# Patient Record
Sex: Female | Born: 1945 | Race: White | Hispanic: No | Smoking: Former smoker
Health system: Southern US, Community
[De-identification: ages and names within clinical notes are randomized; demographics above are authoritative.]

## PROBLEM LIST (undated history)

## (undated) DIAGNOSIS — T8859XA Other complications of anesthesia, initial encounter: Secondary | ICD-10-CM

## (undated) DIAGNOSIS — E119 Type 2 diabetes mellitus without complications: Secondary | ICD-10-CM

## (undated) DIAGNOSIS — Z8719 Personal history of other diseases of the digestive system: Secondary | ICD-10-CM

## (undated) DIAGNOSIS — I10 Essential (primary) hypertension: Secondary | ICD-10-CM

## (undated) DIAGNOSIS — T4145XA Adverse effect of unspecified anesthetic, initial encounter: Secondary | ICD-10-CM

## (undated) DIAGNOSIS — M1990 Unspecified osteoarthritis, unspecified site: Secondary | ICD-10-CM

## (undated) DIAGNOSIS — K589 Irritable bowel syndrome without diarrhea: Secondary | ICD-10-CM

## (undated) DIAGNOSIS — E079 Disorder of thyroid, unspecified: Secondary | ICD-10-CM

## (undated) DIAGNOSIS — K219 Gastro-esophageal reflux disease without esophagitis: Secondary | ICD-10-CM

## (undated) DIAGNOSIS — F329 Major depressive disorder, single episode, unspecified: Secondary | ICD-10-CM

## (undated) DIAGNOSIS — E039 Hypothyroidism, unspecified: Secondary | ICD-10-CM

## (undated) DIAGNOSIS — E785 Hyperlipidemia, unspecified: Secondary | ICD-10-CM

## (undated) DIAGNOSIS — F32A Depression, unspecified: Secondary | ICD-10-CM

## (undated) HISTORY — DX: Disorder of thyroid, unspecified: E07.9

## (undated) HISTORY — PX: JOINT REPLACEMENT: SHX530

## (undated) HISTORY — PX: CHOLECYSTECTOMY: SHX55

## (undated) HISTORY — PX: EYE SURGERY: SHX253

## (undated) HISTORY — PX: SPINE SURGERY: SHX786

## (undated) HISTORY — DX: Hyperlipidemia, unspecified: E78.5

## (undated) HISTORY — DX: Essential (primary) hypertension: I10

## (undated) HISTORY — DX: Gastro-esophageal reflux disease without esophagitis: K21.9

---

## 1970-09-04 HISTORY — PX: ABDOMINAL HYSTERECTOMY: SHX81

## 2004-06-30 ENCOUNTER — Ambulatory Visit: Payer: Self-pay | Admitting: Pain Medicine

## 2004-07-06 ENCOUNTER — Ambulatory Visit: Payer: Self-pay | Admitting: Pain Medicine

## 2004-09-06 ENCOUNTER — Ambulatory Visit: Payer: Self-pay | Admitting: Pain Medicine

## 2004-09-21 ENCOUNTER — Ambulatory Visit: Payer: Self-pay | Admitting: Pain Medicine

## 2004-10-20 ENCOUNTER — Ambulatory Visit: Payer: Self-pay | Admitting: Pain Medicine

## 2004-12-01 ENCOUNTER — Ambulatory Visit: Payer: Self-pay | Admitting: Unknown Physician Specialty

## 2005-06-21 ENCOUNTER — Ambulatory Visit: Payer: Self-pay | Admitting: Ophthalmology

## 2005-12-19 ENCOUNTER — Ambulatory Visit: Payer: Self-pay | Admitting: Ophthalmology

## 2005-12-26 ENCOUNTER — Ambulatory Visit: Payer: Self-pay | Admitting: Ophthalmology

## 2006-07-19 ENCOUNTER — Other Ambulatory Visit: Payer: Self-pay

## 2006-07-30 ENCOUNTER — Inpatient Hospital Stay: Payer: Self-pay | Admitting: Unknown Physician Specialty

## 2007-03-07 ENCOUNTER — Other Ambulatory Visit: Payer: Self-pay

## 2007-03-07 ENCOUNTER — Observation Stay: Payer: Self-pay | Admitting: Endocrinology

## 2007-03-10 ENCOUNTER — Emergency Department: Payer: Self-pay | Admitting: Emergency Medicine

## 2007-03-21 ENCOUNTER — Ambulatory Visit: Payer: Self-pay | Admitting: Orthopaedic Surgery

## 2008-01-22 ENCOUNTER — Ambulatory Visit: Payer: Self-pay | Admitting: Endocrinology

## 2009-07-05 ENCOUNTER — Ambulatory Visit: Payer: Self-pay | Admitting: Pain Medicine

## 2009-07-12 ENCOUNTER — Ambulatory Visit: Payer: Self-pay | Admitting: Pain Medicine

## 2009-07-16 ENCOUNTER — Ambulatory Visit: Payer: Self-pay | Admitting: Pain Medicine

## 2009-07-19 ENCOUNTER — Ambulatory Visit: Payer: Self-pay | Admitting: Pain Medicine

## 2009-09-02 ENCOUNTER — Ambulatory Visit: Payer: Self-pay | Admitting: Pain Medicine

## 2009-09-04 HISTORY — PX: JOINT REPLACEMENT: SHX530

## 2009-09-13 ENCOUNTER — Ambulatory Visit: Payer: Self-pay | Admitting: Pain Medicine

## 2009-10-28 ENCOUNTER — Ambulatory Visit: Payer: Self-pay | Admitting: Pain Medicine

## 2009-11-03 ENCOUNTER — Ambulatory Visit: Payer: Self-pay | Admitting: Pain Medicine

## 2009-12-07 ENCOUNTER — Ambulatory Visit: Payer: Self-pay | Admitting: Pain Medicine

## 2009-12-22 ENCOUNTER — Ambulatory Visit: Payer: Self-pay | Admitting: Pain Medicine

## 2010-02-08 ENCOUNTER — Ambulatory Visit: Payer: Self-pay | Admitting: Physician Assistant

## 2010-03-23 ENCOUNTER — Ambulatory Visit: Payer: Self-pay | Admitting: Unknown Physician Specialty

## 2010-03-29 ENCOUNTER — Inpatient Hospital Stay: Payer: Self-pay | Admitting: Unknown Physician Specialty

## 2010-09-04 HISTORY — PX: COLONOSCOPY: SHX174

## 2010-09-04 HISTORY — PX: UPPER GI ENDOSCOPY: SHX6162

## 2011-05-30 ENCOUNTER — Ambulatory Visit: Payer: Self-pay | Admitting: General Surgery

## 2011-06-02 LAB — PATHOLOGY REPORT

## 2013-01-08 ENCOUNTER — Ambulatory Visit: Payer: Self-pay | Admitting: Orthopedic Surgery

## 2013-01-08 LAB — BASIC METABOLIC PANEL
Chloride: 109 mmol/L — ABNORMAL HIGH (ref 98–107)
Co2: 23 mmol/L (ref 21–32)
Creatinine: 1.09 mg/dL (ref 0.60–1.30)
EGFR (Non-African Amer.): 53 — ABNORMAL LOW
Glucose: 106 mg/dL — ABNORMAL HIGH (ref 65–99)
Osmolality: 279 (ref 275–301)
Potassium: 4.6 mmol/L (ref 3.5–5.1)
Sodium: 138 mmol/L (ref 136–145)

## 2013-01-08 LAB — CBC
HCT: 36.6 % (ref 35.0–47.0)
HGB: 12.2 g/dL (ref 12.0–16.0)
MCH: 31.2 pg (ref 26.0–34.0)
MCHC: 33.2 g/dL (ref 32.0–36.0)
MCV: 94 fL (ref 80–100)
RBC: 3.9 10*6/uL (ref 3.80–5.20)
RDW: 13.6 % (ref 11.5–14.5)
WBC: 7.6 10*3/uL (ref 3.6–11.0)

## 2013-01-08 LAB — PROTIME-INR
INR: 1
Prothrombin Time: 13.3 secs (ref 11.5–14.7)

## 2013-01-21 ENCOUNTER — Inpatient Hospital Stay: Payer: Self-pay | Admitting: Orthopedic Surgery

## 2013-01-22 LAB — BASIC METABOLIC PANEL
Anion Gap: 7 (ref 7–16)
Calcium, Total: 8.5 mg/dL (ref 8.5–10.1)
Chloride: 114 mmol/L — ABNORMAL HIGH (ref 98–107)
Co2: 20 mmol/L — ABNORMAL LOW (ref 21–32)
EGFR (African American): 38 — ABNORMAL LOW
EGFR (Non-African Amer.): 33 — ABNORMAL LOW
Osmolality: 288 (ref 275–301)

## 2013-01-22 LAB — HEMOGLOBIN
HGB: 9 g/dL — ABNORMAL LOW (ref 12.0–16.0)
HGB: 9 g/dL — ABNORMAL LOW (ref 12.0–16.0)

## 2013-01-23 LAB — CBC WITH DIFFERENTIAL/PLATELET
Basophil #: 0 10*3/uL (ref 0.0–0.1)
Basophil %: 0.5 %
HCT: 23.6 % — ABNORMAL LOW (ref 35.0–47.0)
Lymphocyte #: 1.3 10*3/uL (ref 1.0–3.6)
Lymphocyte %: 15.1 %
MCHC: 34.4 g/dL (ref 32.0–36.0)
MCV: 95 fL (ref 80–100)
Monocyte %: 12.3 %
Neutrophil #: 6 10*3/uL (ref 1.4–6.5)
Neutrophil %: 71 %
Platelet: 122 10*3/uL — ABNORMAL LOW (ref 150–440)
RDW: 13.2 % (ref 11.5–14.5)
WBC: 8.5 10*3/uL (ref 3.6–11.0)

## 2013-01-23 LAB — BASIC METABOLIC PANEL
Calcium, Total: 8.5 mg/dL (ref 8.5–10.1)
Chloride: 111 mmol/L — ABNORMAL HIGH (ref 98–107)
Co2: 20 mmol/L — ABNORMAL LOW (ref 21–32)
Creatinine: 1.66 mg/dL — ABNORMAL HIGH (ref 0.60–1.30)
EGFR (African American): 37 — ABNORMAL LOW
EGFR (Non-African Amer.): 32 — ABNORMAL LOW
Glucose: 90 mg/dL (ref 65–99)
Osmolality: 277 (ref 275–301)
Potassium: 4.2 mmol/L (ref 3.5–5.1)
Sodium: 137 mmol/L (ref 136–145)

## 2013-01-23 LAB — URINALYSIS, COMPLETE
Bilirubin,UR: NEGATIVE
Blood: NEGATIVE
Ketone: NEGATIVE
Nitrite: NEGATIVE
RBC,UR: 1 /HPF (ref 0–5)
Specific Gravity: 1.014 (ref 1.003–1.030)
Squamous Epithelial: NONE SEEN
WBC UR: 5 /HPF (ref 0–5)

## 2013-01-23 LAB — PATHOLOGY REPORT

## 2013-01-24 LAB — BASIC METABOLIC PANEL
Anion Gap: 5 — ABNORMAL LOW (ref 7–16)
BUN: 16 mg/dL (ref 7–18)
Calcium, Total: 8.9 mg/dL (ref 8.5–10.1)
Creatinine: 1.22 mg/dL (ref 0.60–1.30)
EGFR (African American): 53 — ABNORMAL LOW
EGFR (Non-African Amer.): 46 — ABNORMAL LOW
Glucose: 109 mg/dL — ABNORMAL HIGH (ref 65–99)
Potassium: 4.1 mmol/L (ref 3.5–5.1)
Sodium: 141 mmol/L (ref 136–145)

## 2013-01-24 LAB — CBC WITH DIFFERENTIAL/PLATELET
Basophil %: 0.8 %
Eosinophil %: 1.3 %
HCT: 27.2 % — ABNORMAL LOW (ref 35.0–47.0)
HGB: 9.4 g/dL — ABNORMAL LOW (ref 12.0–16.0)
Lymphocyte %: 12.4 %
MCH: 31.9 pg (ref 26.0–34.0)
MCHC: 34.6 g/dL (ref 32.0–36.0)
Monocyte %: 11 %
Neutrophil %: 74.5 %
RBC: 2.95 10*6/uL — ABNORMAL LOW (ref 3.80–5.20)
WBC: 8.2 10*3/uL (ref 3.6–11.0)

## 2013-01-24 LAB — URINE CULTURE

## 2013-01-25 LAB — WOUND CULTURE

## 2013-01-28 LAB — CULTURE, BLOOD (SINGLE)

## 2013-06-27 DIAGNOSIS — K3533 Acute appendicitis with perforation and localized peritonitis, with abscess: Secondary | ICD-10-CM

## 2013-06-27 LAB — URINALYSIS, COMPLETE
Bacteria: NONE SEEN
Blood: NEGATIVE
Glucose,UR: NEGATIVE mg/dL (ref 0–75)
Hyaline Cast: 63
Ketone: NEGATIVE
Nitrite: NEGATIVE
Protein: NEGATIVE
RBC,UR: 3 /HPF (ref 0–5)
Squamous Epithelial: 1
WBC UR: 53 /HPF (ref 0–5)

## 2013-06-27 LAB — CBC
HCT: 34.2 % — ABNORMAL LOW (ref 35.0–47.0)
MCH: 31.7 pg (ref 26.0–34.0)
MCHC: 33.7 g/dL (ref 32.0–36.0)
MCV: 94 fL (ref 80–100)
RBC: 3.63 10*6/uL — ABNORMAL LOW (ref 3.80–5.20)
RDW: 14.7 % — ABNORMAL HIGH (ref 11.5–14.5)

## 2013-06-27 LAB — COMPREHENSIVE METABOLIC PANEL
Albumin: 4.2 g/dL (ref 3.4–5.0)
BUN: 22 mg/dL — ABNORMAL HIGH (ref 7–18)
Bilirubin,Total: 0.6 mg/dL (ref 0.2–1.0)
Calcium, Total: 9.8 mg/dL (ref 8.5–10.1)
Chloride: 108 mmol/L — ABNORMAL HIGH (ref 98–107)
Creatinine: 1.64 mg/dL — ABNORMAL HIGH (ref 0.60–1.30)
Osmolality: 275 (ref 275–301)
SGPT (ALT): 26 U/L (ref 12–78)
Total Protein: 8.1 g/dL (ref 6.4–8.2)

## 2013-06-27 LAB — LIPASE, BLOOD: Lipase: 161 U/L (ref 73–393)

## 2013-06-28 HISTORY — PX: APPENDECTOMY: SHX54

## 2013-06-28 LAB — CBC WITH DIFFERENTIAL/PLATELET
Basophil #: 0 10*3/uL (ref 0.0–0.1)
Basophil %: 0.3 %
Eosinophil #: 0 10*3/uL (ref 0.0–0.7)
Eosinophil %: 0 %
HCT: 31.6 % — ABNORMAL LOW (ref 35.0–47.0)
HGB: 10.8 g/dL — ABNORMAL LOW (ref 12.0–16.0)
Lymphocyte %: 9.6 %
MCH: 32.8 pg (ref 26.0–34.0)
MCHC: 34.1 g/dL (ref 32.0–36.0)
Monocyte #: 0.5 x10 3/mm (ref 0.2–0.9)
Monocyte %: 6.6 %
Neutrophil #: 6.2 10*3/uL (ref 1.4–6.5)
Neutrophil %: 83.5 %
RBC: 3.28 10*6/uL — ABNORMAL LOW (ref 3.80–5.20)
RDW: 14.7 % — ABNORMAL HIGH (ref 11.5–14.5)

## 2013-06-28 LAB — BASIC METABOLIC PANEL
Anion Gap: 9 (ref 7–16)
Co2: 20 mmol/L — ABNORMAL LOW (ref 21–32)
Creatinine: 1.96 mg/dL — ABNORMAL HIGH (ref 0.60–1.30)
EGFR (African American): 30 — ABNORMAL LOW
Osmolality: 281 (ref 275–301)
Potassium: 4.3 mmol/L (ref 3.5–5.1)
Sodium: 139 mmol/L (ref 136–145)

## 2013-06-29 ENCOUNTER — Inpatient Hospital Stay: Payer: Self-pay | Admitting: General Surgery

## 2013-06-29 LAB — BASIC METABOLIC PANEL
Anion Gap: 9 (ref 7–16)
BUN: 26 mg/dL — ABNORMAL HIGH (ref 7–18)
Calcium, Total: 8.8 mg/dL (ref 8.5–10.1)
Co2: 19 mmol/L — ABNORMAL LOW (ref 21–32)
Glucose: 105 mg/dL — ABNORMAL HIGH (ref 65–99)
Osmolality: 275 (ref 275–301)
Potassium: 4.1 mmol/L (ref 3.5–5.1)
Sodium: 135 mmol/L — ABNORMAL LOW (ref 136–145)

## 2013-06-29 LAB — CBC WITH DIFFERENTIAL/PLATELET
Basophil #: 0 10*3/uL (ref 0.0–0.1)
Basophil %: 0.2 %
HCT: 27.8 % — ABNORMAL LOW (ref 35.0–47.0)
Lymphocyte %: 7.7 %
MCH: 32.5 pg (ref 26.0–34.0)
MCHC: 34.1 g/dL (ref 32.0–36.0)
MCV: 95 fL (ref 80–100)
Monocyte #: 0.7 x10 3/mm (ref 0.2–0.9)
Monocyte %: 7 %
Neutrophil #: 8.5 10*3/uL — ABNORMAL HIGH (ref 1.4–6.5)
Platelet: 208 10*3/uL (ref 150–440)
RBC: 2.91 10*6/uL — ABNORMAL LOW (ref 3.80–5.20)
RDW: 14.4 % (ref 11.5–14.5)
WBC: 10 10*3/uL (ref 3.6–11.0)

## 2013-06-30 ENCOUNTER — Encounter: Payer: Self-pay | Admitting: General Surgery

## 2013-06-30 LAB — CBC WITH DIFFERENTIAL/PLATELET
Basophil #: 0 10*3/uL (ref 0.0–0.1)
Basophil %: 0.3 %
Eosinophil #: 0 10*3/uL (ref 0.0–0.7)
HGB: 9.8 g/dL — ABNORMAL LOW (ref 12.0–16.0)
Lymphocyte %: 7.9 %
MCH: 32.4 pg (ref 26.0–34.0)
MCHC: 34.2 g/dL (ref 32.0–36.0)
MCV: 95 fL (ref 80–100)
Monocyte %: 6.1 %
Neutrophil #: 9 10*3/uL — ABNORMAL HIGH (ref 1.4–6.5)
Neutrophil %: 85.3 %
Platelet: 255 10*3/uL (ref 150–440)
RBC: 3.01 10*6/uL — ABNORMAL LOW (ref 3.80–5.20)
RDW: 14.2 % (ref 11.5–14.5)

## 2013-06-30 LAB — BASIC METABOLIC PANEL
Anion Gap: 9 (ref 7–16)
BUN: 25 mg/dL — ABNORMAL HIGH (ref 7–18)
Calcium, Total: 9.3 mg/dL (ref 8.5–10.1)
Chloride: 104 mmol/L (ref 98–107)
Co2: 22 mmol/L (ref 21–32)
Creatinine: 1.79 mg/dL — ABNORMAL HIGH (ref 0.60–1.30)
EGFR (Non-African Amer.): 29 — ABNORMAL LOW
Osmolality: 274 (ref 275–301)
Potassium: 4.1 mmol/L (ref 3.5–5.1)

## 2013-07-01 LAB — PATHOLOGY REPORT

## 2013-07-02 LAB — BASIC METABOLIC PANEL
Calcium, Total: 9.2 mg/dL (ref 8.5–10.1)
Chloride: 108 mmol/L — ABNORMAL HIGH (ref 98–107)
Creatinine: 1.34 mg/dL — ABNORMAL HIGH (ref 0.60–1.30)
EGFR (African American): 47 — ABNORMAL LOW
Glucose: 78 mg/dL (ref 65–99)
Osmolality: 282 (ref 275–301)

## 2013-07-02 LAB — CBC WITH DIFFERENTIAL/PLATELET
Basophil %: 0.3 %
Eosinophil #: 0.1 10*3/uL (ref 0.0–0.7)
Eosinophil %: 0.9 %
HCT: 29.7 % — ABNORMAL LOW (ref 35.0–47.0)
HGB: 10.2 g/dL — ABNORMAL LOW (ref 12.0–16.0)
Lymphocyte #: 1.8 10*3/uL (ref 1.0–3.6)
Lymphocyte %: 20.8 %
MCH: 32 pg (ref 26.0–34.0)
MCHC: 34.4 g/dL (ref 32.0–36.0)
Monocyte #: 0.7 x10 3/mm (ref 0.2–0.9)
Monocyte %: 8.4 %
Neutrophil #: 6 10*3/uL (ref 1.4–6.5)
Neutrophil %: 69.6 %
Platelet: 368 10*3/uL (ref 150–440)
RBC: 3.19 10*6/uL — ABNORMAL LOW (ref 3.80–5.20)
WBC: 8.6 10*3/uL (ref 3.6–11.0)

## 2013-07-03 ENCOUNTER — Encounter: Payer: Self-pay | Admitting: General Surgery

## 2013-07-03 LAB — URINALYSIS, COMPLETE
Blood: NEGATIVE
Glucose,UR: NEGATIVE mg/dL (ref 0–75)
Hyaline Cast: 5
Nitrite: NEGATIVE
Protein: 30
RBC,UR: 4 /HPF (ref 0–5)
Specific Gravity: 1.013 (ref 1.003–1.030)
WBC UR: 89 /HPF (ref 0–5)

## 2013-07-04 ENCOUNTER — Encounter: Payer: Self-pay | Admitting: General Surgery

## 2013-07-04 LAB — BASIC METABOLIC PANEL
BUN: 6 mg/dL — ABNORMAL LOW (ref 7–18)
Calcium, Total: 8.1 mg/dL — ABNORMAL LOW (ref 8.5–10.1)
Chloride: 109 mmol/L — ABNORMAL HIGH (ref 98–107)
Co2: 25 mmol/L (ref 21–32)
EGFR (African American): 45 — ABNORMAL LOW
Glucose: 103 mg/dL — ABNORMAL HIGH (ref 65–99)

## 2013-07-04 LAB — URINALYSIS, COMPLETE
Bilirubin,UR: NEGATIVE
Blood: NEGATIVE
Glucose,UR: NEGATIVE mg/dL (ref 0–75)
Nitrite: NEGATIVE
Ph: 5 (ref 4.5–8.0)
Protein: NEGATIVE
RBC,UR: 2 /HPF (ref 0–5)
Specific Gravity: 1.012 (ref 1.003–1.030)
WBC UR: 12 /HPF (ref 0–5)

## 2013-07-05 LAB — URINE CULTURE

## 2013-07-15 ENCOUNTER — Encounter: Payer: Self-pay | Admitting: General Surgery

## 2013-07-15 ENCOUNTER — Ambulatory Visit (INDEPENDENT_AMBULATORY_CARE_PROVIDER_SITE_OTHER): Payer: Self-pay | Admitting: General Surgery

## 2013-07-15 VITALS — BP 122/78 | HR 84 | Resp 16 | Ht 63.0 in | Wt 206.0 lb

## 2013-07-15 DIAGNOSIS — R197 Diarrhea, unspecified: Secondary | ICD-10-CM | POA: Insufficient documentation

## 2013-07-15 DIAGNOSIS — R109 Unspecified abdominal pain: Secondary | ICD-10-CM

## 2013-07-15 DIAGNOSIS — K3580 Unspecified acute appendicitis: Secondary | ICD-10-CM

## 2013-07-15 NOTE — Progress Notes (Signed)
Patient ID: Brandy George, female   DOB: 11/20/1945, 67 y.o.   MRN: [id number]  Chief Complaint  Patient presents with  . Routine Post Op    Post op emergency appendectomy    HPI Brandy George is a 67 y.o. female who presents for a post op emergency appendectomy. The procedure was performed on 06/28/13. The patient states since surgery she has had difficultly swallowing food as well as medications. She complains of diarrhea and has not had a solid bowel movement since surgery. She also states she is having abdominal soreness. She states Sunday she had 2 bad diarrhea spells. She has a bloating sensation.   HPI  Past Medical History  Diagnosis Date  . Hypertension   . Thyroid disease   . Hyperlipidemia   . GERD (gastroesophageal reflux disease)     Past Surgical History  Procedure Laterality Date  . Appendectomy  2014  . Cholecystectomy    . Joint replacement Bilateral [id number]  . Joint replacement Right 2011    hip  . Spine surgery      lower back  . Abdominal hysterectomy  1972    partial    History reviewed. No pertinent family history.  Social History History  Substance Use Topics  . Smoking status: Former Smoker -- 20 years  . Smokeless tobacco: Never Used  . Alcohol Use: Yes    Allergies  Allergen Reactions  . Morphine Itching and Rash  . Tape Other (See Comments) and Rash    Current Outpatient Prescriptions  Medication Sig Dispense Refill  . allopurinol (ZYLOPRIM) 100 MG tablet Take 1 tablet by mouth 2 (two) times daily.      Marland Kitchen ALPRAZolam (XANAX) 0.25 MG tablet Take 1 tablet by mouth daily.      Francia Greaves THYROID PO Take 3 g by mouth daily.      Marland Kitchen gemfibrozil (LOPID) 600 MG tablet Take 1 tablet by mouth daily.      Marland Kitchen lisinopril-hydrochlorothiazide (PRINZIDE,ZESTORETIC) 20-25 MG per tablet Take 1 tablet by mouth daily.      . Multiple Vitamin (MULTIVITAMIN) tablet Take 1 tablet by mouth daily.      Marland Kitchen omeprazole (PRILOSEC) 40 MG capsule Take 1  capsule by mouth daily.      . simvastatin (ZOCOR) 20 MG tablet Take 1 tablet by mouth daily.      Marland Kitchen triamterene-hydrochlorothiazide (MAXZIDE-25) 37.5-25 MG per tablet Take 1 tablet by mouth daily.       No current facility-administered medications for this visit.    Review of Systems Review of Systems  Constitutional: Positive for appetite change.  Respiratory: Negative.   Cardiovascular: Negative.   Gastrointestinal: Positive for abdominal pain and diarrhea.    Blood pressure 122/78, pulse 84, resp. rate 16, height 5\' 3"  (1.6 m), weight 206 lb (93.441 kg).  Physical Exam Physical Exam  Constitutional: She is oriented to person, place, and time. She appears well-developed and well-nourished.  Cardiovascular: Normal rate, regular rhythm and normal heart sounds.   No murmur heard. Pulmonary/Chest: Effort normal and breath sounds normal.  Abdominal: Soft. Normal appearance and bowel sounds are normal. There is tenderness in the right lower quadrant.  Genitourinary: Rectal exam shows no mass (no evidence of pelvic abscess on rectal exam.) and anal tone normal. Guaiac negative stool.  Neurological: She is alert and oriented to person, place, and time.  Skin: Skin is warm and dry.    Data Reviewed Pathology showed acute appendicitis with marked transmural acute inflammation.  June 30, 2013.  Assessment    Persistent loose stools status post appendectomy for acute appendicitis complicated by a postoperative ileus.     Plan    The patient may have residual inflammation or less likely C. Difficile colitis.  Considering her persistent mild tenderness, decreased appetite and general malaise, a post appendectomy CT will be obtained.     Patient to have the following labs drawn at [facility] lab: CBC and Met B.   This patient has also been scheduled for a CT abdomen/pelvis with IV and oral contrast at [facility] for 07-17-13 at 9 am (arrive at 8:45 am).  Prep: no solids 4 hours prior  but patient may have clear liquids up until exam time, pick up prep kit, and take medication list. Patient verbalizes understanding.     Robert Bellow 07/15/2013, 9:03 PM

## 2013-07-15 NOTE — Patient Instructions (Addendum)
Follow up to be announced. Patient to have lab work done today. Also patient to have CT scan of the abdomen scheduled.

## 2013-07-16 ENCOUNTER — Other Ambulatory Visit: Payer: Self-pay

## 2013-07-16 ENCOUNTER — Telehealth: Payer: Self-pay

## 2013-07-16 DIAGNOSIS — R197 Diarrhea, unspecified: Secondary | ICD-10-CM

## 2013-07-16 LAB — BASIC METABOLIC PANEL
BUN/Creatinine Ratio: 10 — ABNORMAL LOW (ref 11–26)
BUN: 15 mg/dL (ref 8–27)
CO2: 25 mmol/L (ref 18–29)
Calcium: 8.2 mg/dL — ABNORMAL LOW (ref 8.6–10.2)
Chloride: 95 mmol/L — ABNORMAL LOW (ref 97–108)
Creatinine, Ser: 1.55 mg/dL — ABNORMAL HIGH (ref 0.57–1.00)
GFR calc Af Amer: 40 mL/min/{1.73_m2} — ABNORMAL LOW (ref >59)
GFR calc non Af Amer: 34 mL/min/{1.73_m2} — ABNORMAL LOW (ref >59)
Glucose: 119 mg/dL — ABNORMAL HIGH (ref 65–99)
Potassium: 3.6 mmol/L (ref 3.5–5.2)

## 2013-07-16 LAB — CBC WITH DIFFERENTIAL
Basophils Absolute: 0 10*3/uL (ref 0.0–0.2)
Eosinophils Absolute: 0 10*3/uL (ref 0.0–0.4)
HCT: 32.7 % — ABNORMAL LOW (ref 34.0–46.6)
Immature Granulocytes: 0 %
Lymphs: 24 %
MCH: 31 pg (ref 26.6–33.0)
MCHC: 33.9 g/dL (ref 31.5–35.7)
Monocytes: 10 %
Neutrophils Absolute: 7.9 10*3/uL — ABNORMAL HIGH (ref 1.4–7.0)
Platelets: 482 10*3/uL — ABNORMAL HIGH (ref 150–379)
RDW: 14.3 % (ref 12.3–15.4)
WBC: 12.2 10*3/uL — ABNORMAL HIGH (ref 3.4–10.8)

## 2013-07-16 NOTE — Telephone Encounter (Signed)
Spoke with patient and let her know Dr Bary Castilla wanted her to have a Stool C-Diff completed today. Patient to come in before noon to pick up lab slip and get instructions and collection kit from [employer].

## 2013-07-17 ENCOUNTER — Encounter: Payer: Self-pay | Admitting: General Surgery

## 2013-07-17 ENCOUNTER — Ambulatory Visit (INDEPENDENT_AMBULATORY_CARE_PROVIDER_SITE_OTHER): Payer: Medicare Other | Admitting: General Surgery

## 2013-07-17 ENCOUNTER — Ambulatory Visit: Payer: Self-pay | Admitting: General Surgery

## 2013-07-17 VITALS — BP 142/68 | HR 84 | Temp 98.1°F | Resp 18 | Ht 63.0 in | Wt 203.0 lb

## 2013-07-17 DIAGNOSIS — K3580 Unspecified acute appendicitis: Secondary | ICD-10-CM

## 2013-07-17 DIAGNOSIS — R110 Nausea: Secondary | ICD-10-CM

## 2013-07-17 MED ORDER — AMOXICILLIN-POT CLAVULANATE 875-125 MG PO TABS: 1.0000 | ORAL_TABLET | Freq: Two times a day (BID) | ORAL | Status: DC

## 2013-07-17 MED ORDER — ONDANSETRON 4 MG PO TBDP: 4.0000 mg | ORAL_TABLET | Freq: Four times a day (QID) | ORAL | Status: DC | PRN

## 2013-07-17 NOTE — Patient Instructions (Addendum)
Patient to return in one week. Patient to get on a probiotic.

## 2013-07-17 NOTE — Progress Notes (Signed)
Patient ID: Brandy George, female   DOB: 07-14-1946, 67 y.o.   MRN: [id number]  Chief Complaint  Patient presents with  . Routine Post Op    HPI Brandy George is a 67 y.o. female here today for her post op Appendectomy done on 06/28/13. Patient can a ct done 07/17/13. Patient states she is still having abdominal paincome nausea and  Diarrhea. She is tolerating liquids and soft foods. She has a long list of foods that she does not routinely. She is not aware of any fever or chills. She reports difficulty with swallowing pills. The patient has no difficulty with liquids. She did not have a nasogastric tube during her hospitalization.  HPI  Past Medical History  Diagnosis Date  . Hypertension   . Thyroid disease   . Hyperlipidemia   . GERD (gastroesophageal reflux disease)     Past Surgical History  Procedure Laterality Date  . Cholecystectomy    . Joint replacement Bilateral [id number]  . Joint replacement Right 2011    hip  . Spine surgery      lower back  . Abdominal hysterectomy  1972    partial  . Appendectomy  06/28/13    No family history on file.  Social History History  Substance Use Topics  . Smoking status: Former Smoker -- 20 years  . Smokeless tobacco: Never Used  . Alcohol Use: Yes    Allergies  Allergen Reactions  . Morphine Itching and Rash  . Tape Other (See Comments) and Rash    Current Outpatient Prescriptions  Medication Sig Dispense Refill  . allopurinol (ZYLOPRIM) 100 MG tablet Take 1 tablet by mouth 2 (two) times daily.      Marland Kitchen ALPRAZolam (XANAX) 0.25 MG tablet Take 1 tablet by mouth daily.      Francia Greaves THYROID PO Take 3 g by mouth daily.      Marland Kitchen gemfibrozil (LOPID) 600 MG tablet Take 1 tablet by mouth daily.      Marland Kitchen lisinopril-hydrochlorothiazide (PRINZIDE,ZESTORETIC) 20-25 MG per tablet Take 1 tablet by mouth daily.      . Multiple Vitamin (MULTIVITAMIN) tablet Take 1 tablet by mouth daily.      Marland Kitchen omeprazole (PRILOSEC) 40 MG  capsule Take 1 capsule by mouth daily.      . simvastatin (ZOCOR) 20 MG tablet Take 1 tablet by mouth daily.      Marland Kitchen triamterene-hydrochlorothiazide (MAXZIDE-25) 37.5-25 MG per tablet Take 1 tablet by mouth daily.      Marland Kitchen amoxicillin-clavulanate (AUGMENTIN) 875-125 MG per tablet Take 1 tablet by mouth 2 (two) times daily.  20 tablet  1  . ondansetron (ZOFRAN ODT) 4 MG disintegrating tablet Take 1 tablet (4 mg total) by mouth 4 (four) times daily as needed for nausea or vomiting.  20 tablet  1   No current facility-administered medications for this visit.    Review of Systems Review of Systems  Constitutional: Negative.   Respiratory: Negative.   Cardiovascular: Negative.   Gastrointestinal: Positive for nausea, abdominal pain and diarrhea. Negative for blood in stool, abdominal distention, anal bleeding and rectal pain.    Blood pressure 142/68, pulse 84, temperature 98.1 F (36.7 C), resp. rate 18, height 5\' 3"  (1.6 m), weight 203 lb (92.08 kg).  Physical Exam Physical Exam  Constitutional: She is oriented to person, place, and time. She appears well-developed and well-nourished.  Eyes: No scleral icterus.  Cardiovascular: Normal rate and regular rhythm.   Abdominal: Soft. Normal appearance and bowel  sounds are normal.    Lymphadenopathy:    She has no cervical adenopathy.  Neurological: She is alert and oriented to person, place, and time.  Skin: Skin is dry.    Data Reviewed Laboratory studies obtained a #11, 2014 showed a mild leukocytosis as well as a slight rise in her creatinine from discharge low at 1.3 to her present level of 1.5.  CT completed earlier today shows inflammatory changes in the right lower quadrant as well as a less than 5 cm fluid collection in the pelvis. (This was not clinically appreciated on palpation during her rectal exam 2 days ago.  Assessment    Post appendectomy and intra-abdominal fluid collection, possible early abscess. Likely source for  present diarrhea.     Plan    Stool culture for C. Difficile was submitted this morning. The patient will be placed on Augmentin 875 mg b.i.d. She is encouraged to either make use of the overt or a probiotic daily. Follow up examination in one week.        Robert Bellow 07/18/2013, 5:54 AM

## 2013-07-23 ENCOUNTER — Encounter: Payer: Self-pay | Admitting: General Surgery

## 2013-07-23 ENCOUNTER — Ambulatory Visit (INDEPENDENT_AMBULATORY_CARE_PROVIDER_SITE_OTHER): Payer: Self-pay | Admitting: General Surgery

## 2013-07-23 VITALS — BP 130/62 | HR 68 | Temp 98.3°F | Resp 16 | Ht 63.0 in | Wt 204.0 lb

## 2013-07-23 DIAGNOSIS — K3580 Unspecified acute appendicitis: Secondary | ICD-10-CM

## 2013-07-23 NOTE — Progress Notes (Signed)
Patient ID: Brandy George, female   DOB: May 14, 1946, 67 y.o.   MRN: [id number]  Chief Complaint  Patient presents with  . Other    HPI Brandy George is a 67 y.o. female here today for her post op Appendectomy done on 06/28/13. Patient can a ct done 07/17/13. Patient states she is still having abdominal pain, especially after meals. She is tolerating liquids and soft foods. Patient states she has been having 4 to 5 bowel movements daily, usually in the morning.  HPI  Past Medical History  Diagnosis Date  . Hypertension   . Thyroid disease   . Hyperlipidemia   . GERD (gastroesophageal reflux disease)     Past Surgical History  Procedure Laterality Date  . Cholecystectomy    . Joint replacement Bilateral [id number]  . Joint replacement Right 2011    hip  . Spine surgery      lower back  . Abdominal hysterectomy  1972    partial  . Appendectomy  06/28/13    No family history on file.  Social History History  Substance Use Topics  . Smoking status: Former Smoker -- 20 years  . Smokeless tobacco: Never Used  . Alcohol Use: Yes    Allergies  Allergen Reactions  . Morphine Itching and Rash  . Tape Other (See Comments) and Rash    Current Outpatient Prescriptions  Medication Sig Dispense Refill  . allopurinol (ZYLOPRIM) 100 MG tablet Take 1 tablet by mouth 2 (two) times daily.      Marland Kitchen ALPRAZolam (XANAX) 0.25 MG tablet Take 1 tablet by mouth daily.      Marland Kitchen amoxicillin-clavulanate (AUGMENTIN) 875-125 MG per tablet Take 1 tablet by mouth 2 (two) times daily.  20 tablet  1  . ARMOUR THYROID PO Take 3 g by mouth daily.      Marland Kitchen gemfibrozil (LOPID) 600 MG tablet Take 1 tablet by mouth daily.      Marland Kitchen lisinopril-hydrochlorothiazide (PRINZIDE,ZESTORETIC) 20-25 MG per tablet Take 1 tablet by mouth daily.      . Multiple Vitamin (MULTIVITAMIN) tablet Take 1 tablet by mouth daily.      Marland Kitchen omeprazole (PRILOSEC) 40 MG capsule Take 1 capsule by mouth daily.      . ondansetron  (ZOFRAN ODT) 4 MG disintegrating tablet Take 1 tablet (4 mg total) by mouth 4 (four) times daily as needed for nausea or vomiting.  20 tablet  1  . simvastatin (ZOCOR) 20 MG tablet Take 1 tablet by mouth daily.      Marland Kitchen triamterene-hydrochlorothiazide (MAXZIDE-25) 37.5-25 MG per tablet Take 1 tablet by mouth daily.       No current facility-administered medications for this visit.    Review of Systems Review of Systems  Constitutional: Negative.   Respiratory: Negative.   Cardiovascular: Negative.     Blood pressure 130/62, pulse 68, temperature 98.3 F (36.8 C), resp. rate 16, height 5\' 3"  (1.6 m), weight 204 lb (92.534 kg). The patient's weight is up 1 pound from last week. Physical Exam Physical Exam  Constitutional: She is oriented to person, place, and time. She appears well-developed and well-nourished.  Eyes: No scleral icterus.  Cardiovascular: Normal rate, regular rhythm and normal heart sounds.   Pulmonary/Chest: Breath sounds normal.  Abdominal: Soft. Bowel sounds are normal. There is no tenderness.  Lymphadenopathy:    She has no cervical adenopathy.  Neurological: She is alert and oriented to person, place, and time.  Skin: Skin is warm and dry.  Data Reviewed CT previously reviewed. C. Difficile stool sample results pending.  Assessment    Resolving intra-abdominal abscess/fluid collection. Good tolerance of oral Augmentin.     Plan    The patient reported decreased stool frequency is encouraging. She is still experiencing postprandial pain. There was no evidence of obstruction or small intestinal dilatation on the CT completed last week. She's been encouraged to eat a high-protein diet.        Robert Bellow 07/23/2013, 8:30 PM

## 2013-07-23 NOTE — Patient Instructions (Signed)
Patient to return in two weeks  

## 2013-07-25 LAB — C DIFFICILE, CYTOTOXIN B

## 2013-07-25 LAB — C DIFFICILE TOXINS A+B W/RFLX: C difficile Toxins A+B, EIA: NEGATIVE

## 2013-08-04 ENCOUNTER — Ambulatory Visit: Payer: Medicare Other | Admitting: General Surgery

## 2013-08-04 ENCOUNTER — Encounter: Payer: Self-pay | Admitting: General Surgery

## 2013-08-04 ENCOUNTER — Ambulatory Visit (INDEPENDENT_AMBULATORY_CARE_PROVIDER_SITE_OTHER): Payer: Self-pay | Admitting: General Surgery

## 2013-08-04 VITALS — BP 124/76 | HR 80 | Resp 14 | Ht 63.5 in | Wt 204.0 lb

## 2013-08-04 DIAGNOSIS — K3580 Unspecified acute appendicitis: Secondary | ICD-10-CM

## 2013-08-04 NOTE — Patient Instructions (Signed)
Patient to return  

## 2013-08-04 NOTE — Progress Notes (Signed)
Patient ID: Brandy George, female   DOB: 1946-02-16, 67 y.o.   MRN: [id number]  Chief Complaint  Patient presents with  . Follow-up     post op appendectomy    HPI CHELLY George is a 67 y.o. female who presents for a post op appendectomy. The procedure was performed on 06/28/13. The patient states she is still having intermittent lower right quadrant abdominal pain. She states that some days the pain is better and then see has had days that the pain is really bad. 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 Increase physical activity seems to correlate with episodes of increasing discomfort.  She states that her bowel movements are beginning to get back to normal. She is now only having bowel movements 3-4 times daily,and reports the consistency is almost a formed stool at this time. Appetite is better.   She has been making use of probiotics as requested.  HPI  Past Medical History  Diagnosis Date  . Hypertension   . Thyroid disease   . Hyperlipidemia   . GERD (gastroesophageal reflux disease)     Past Surgical History  Procedure Laterality Date  . Cholecystectomy    . Joint replacement Bilateral [id number]  . Joint replacement Right 2011    hip  . Spine surgery      lower back  . Abdominal hysterectomy  1972    partial  . Appendectomy  06/28/13  . Colonoscopy  2012  . Upper gi endoscopy  2012    History reviewed. No pertinent family history.  Social History History  Substance Use Topics  . Smoking status: Former Smoker -- 20 years  . Smokeless tobacco: Never Used  . Alcohol Use: Yes    Allergies  Allergen Reactions  . Morphine Itching and Rash  . Tape Other (See Comments) and Rash    Current Outpatient Prescriptions  Medication Sig Dispense Refill  . allopurinol (ZYLOPRIM) 100 MG tablet Take 1 tablet by mouth 2 (two) times daily.      Marland Kitchen ALPRAZolam (XANAX)  0.25 MG tablet Take 1 tablet by mouth daily.      Francia Greaves THYROID PO Take 3 g by mouth daily.      Marland Kitchen gemfibrozil (LOPID) 600 MG tablet Take 1 tablet by mouth daily.      Marland Kitchen lisinopril-hydrochlorothiazide (PRINZIDE,ZESTORETIC) 20-25 MG per tablet Take 1 tablet by mouth daily.      . Multiple Vitamin (MULTIVITAMIN) tablet Take 1 tablet by mouth daily.      Marland Kitchen omeprazole (PRILOSEC) 40 MG capsule Take 1 capsule by mouth daily.      . simvastatin (ZOCOR) 20 MG tablet Take 1 tablet by mouth daily.      Marland Kitchen triamterene-hydrochlorothiazide (MAXZIDE-25) 37.5-25 MG per tablet Take 1 tablet by mouth daily.       No current facility-administered medications for this visit.    Review of Systems Review of Systems  Constitutional: Negative.   Respiratory: Negative.   Cardiovascular: Negative.   Gastrointestinal: Positive for abdominal pain.    Blood pressure 124/76, pulse 80, resp. rate 14, height 5' 3.5" (1.613 m), weight 204 lb (92.534 kg).  Physical Exam Physical Exam  Constitutional: She is oriented to person, place, and time. She appears well-developed and well-nourished.  Neck: Neck supple. No thyromegaly present.  Cardiovascular: Normal rate, regular rhythm and normal heart sounds.   No murmur heard. Pulmonary/Chest: Effort normal and  breath sounds normal.  Abdominal: Soft. Normal appearance and bowel sounds are normal. There is tenderness in the right lower quadrant.    Lymphadenopathy:    She has no cervical adenopathy.  Neurological: She is alert and oriented to person, place, and time.  Skin: Skin is warm and dry.    Data Reviewed Note new data. Laboratory studies completed last week at her primary care physician's office showed a normal white blood cell count.  CBC was 9600 with 49% polys, 41% lymphocytes. Hemoglobin 11.0. Creatinine 1.15 (improved from that noted during her hospitalization). Estimated GFR low at 49. Alkaline phosphatase minimally elevated at 129 (39-117). Normal  bilirubin at 0.3.  Assessment    Resolution of post appendectomy abscess.     Plan    The patient shows continued improvement. She is now 13 days after her antibiotic therapy was completed.  I expect continued improvement.  We'll arrange for a followup exam in approximately 4-6 weeks. She was encouraged to call earlier she notices recurrent symptoms or worsening abdominal pain.        Robert Bellow 08/04/2013, 8:51 PM

## 2013-09-16 ENCOUNTER — Ambulatory Visit (INDEPENDENT_AMBULATORY_CARE_PROVIDER_SITE_OTHER): Payer: Self-pay | Admitting: General Surgery

## 2013-09-16 ENCOUNTER — Encounter: Payer: Self-pay | Admitting: General Surgery

## 2013-09-16 VITALS — BP 130/70 | HR 82 | Resp 15 | Ht 63.5 in | Wt 202.0 lb

## 2013-09-16 DIAGNOSIS — K3580 Unspecified acute appendicitis: Secondary | ICD-10-CM

## 2013-09-16 NOTE — Progress Notes (Signed)
Patient ID: Brandy George, female   DOB: 1946/09/01, 68 y.o.   MRN: [id number]  Chief Complaint  Patient presents with  . Routine Post Op    appendectomy    HPI Brandy George is a 68 y.o. female.  Here today for follow up from appendectomy 06-28-13. States she is getting along well. The previous intermittent pain does seem to be better. Still having some diarrhea and loose stools. Currently on Prednisone for pneumonia under the care of Dr. Carmela Rima. HPI  Past Medical History  Diagnosis Date  . Hypertension   . Thyroid disease   . Hyperlipidemia   . GERD (gastroesophageal reflux disease)     Past Surgical History  Procedure Laterality Date  . Cholecystectomy    . Joint replacement Bilateral [id number]  . Joint replacement Right 2011    hip  . Spine surgery      lower back  . Abdominal hysterectomy  1972    partial  . Appendectomy  06/28/13  . Colonoscopy  2012  . Upper gi endoscopy  2012    No family history on file.  Social History History  Substance Use Topics  . Smoking status: Former Smoker -- 20 years  . Smokeless tobacco: Never Used  . Alcohol Use: Yes    Allergies  Allergen Reactions  . Morphine Itching and Rash  . Tape Other (See Comments) and Rash    Current Outpatient Prescriptions  Medication Sig Dispense Refill  . allopurinol (ZYLOPRIM) 100 MG tablet Take 1 tablet by mouth 2 (two) times daily.      Marland Kitchen ALPRAZolam (XANAX) 0.25 MG tablet Take 1 tablet by mouth daily.      Francia Greaves THYROID PO Take 3 g by mouth daily.      Marland Kitchen gemfibrozil (LOPID) 600 MG tablet Take 1 tablet by mouth daily.      Marland Kitchen lisinopril-hydrochlorothiazide (PRINZIDE,ZESTORETIC) 20-25 MG per tablet Take 1 tablet by mouth daily.      . Multiple Vitamin (MULTIVITAMIN) tablet Take 1 tablet by mouth daily.      Marland Kitchen omeprazole (PRILOSEC) 40 MG capsule Take 1 capsule by mouth daily.      . predniSONE (STERAPRED UNI-PAK) 10 MG tablet       . Probiotic Product (PROBIOTIC DAILY PO)  Take 1 capsule by mouth daily.      . simvastatin (ZOCOR) 20 MG tablet Take 1 tablet by mouth daily.       No current facility-administered medications for this visit.    Review of Systems Review of Systems  Constitutional: Negative.   Respiratory: Negative.   Cardiovascular: Negative.     Blood pressure 130/70, pulse 82, resp. rate 15, height 5' 3.5" (1.613 m), weight 202 lb (91.627 kg).  Physical Exam Physical Exam  Constitutional: She is oriented to person, place, and time. She appears well-developed and well-nourished.  Neck: Neck supple.  Cardiovascular: Normal rate and regular rhythm.   Pulmonary/Chest: Effort normal and breath sounds normal.  Abdominal: Soft. Normal appearance and bowel sounds are normal. There is no tenderness.  Lymphadenopathy:    She has no cervical adenopathy.  Neurological: She is alert and oriented to person, place, and time.    Data Reviewed None  Assessment    Good recovery post appendectomy and postoperative small intra-abdominal abscess.     Plan    The patient was encouraged to call should she have any new concerns. Follow up otherwise will be on an as needed basis or as previously scheduled  for her colonoscopy.        Robert Bellow 09/16/2013, 8:04 PM

## 2014-06-16 ENCOUNTER — Observation Stay: Payer: Self-pay | Admitting: Internal Medicine

## 2014-06-16 LAB — BASIC METABOLIC PANEL
ANION GAP: 8 (ref 7–16)
BUN: 35 mg/dL — AB (ref 7–18)
CALCIUM: 9.9 mg/dL (ref 8.5–10.1)
CO2: 18 mmol/L — AB (ref 21–32)
Chloride: 114 mmol/L — ABNORMAL HIGH (ref 98–107)
Creatinine: 2.01 mg/dL — ABNORMAL HIGH (ref 0.60–1.30)
EGFR (African American): 32 — ABNORMAL LOW
EGFR (Non-African Amer.): 26 — ABNORMAL LOW
Glucose: 96 mg/dL (ref 65–99)
OSMOLALITY: 287 (ref 275–301)
Potassium: 4.8 mmol/L (ref 3.5–5.1)
SODIUM: 140 mmol/L (ref 136–145)

## 2014-06-16 LAB — URINALYSIS, COMPLETE
BLOOD: NEGATIVE
Bilirubin,UR: NEGATIVE
Glucose,UR: NEGATIVE mg/dL (ref 0–75)
Ketone: NEGATIVE
Nitrite: NEGATIVE
PH: 5 (ref 4.5–8.0)
Protein: 30
SPECIFIC GRAVITY: 1.006 (ref 1.003–1.030)
Squamous Epithelial: <1

## 2014-06-16 LAB — CBC
HCT: 33.2 % — AB (ref 35.0–47.0)
HGB: 10.7 g/dL — ABNORMAL LOW (ref 12.0–16.0)
MCH: 30.9 pg (ref 26.0–34.0)
MCHC: 32.3 g/dL (ref 32.0–36.0)
MCV: 96 fL (ref 80–100)
PLATELETS: 249 10*3/uL (ref 150–440)
RBC: 3.47 10*6/uL — ABNORMAL LOW (ref 3.80–5.20)
RDW: 13.3 % (ref 11.5–14.5)
WBC: 8.1 10*3/uL (ref 3.6–11.0)

## 2014-06-16 LAB — PROTIME-INR
INR: 1
Prothrombin Time: 12.8 secs (ref 11.5–14.7)

## 2014-06-16 LAB — APTT: Activated PTT: 30.9 secs (ref 23.6–35.9)

## 2014-06-17 LAB — CBC WITH DIFFERENTIAL/PLATELET
Basophil #: 0 10*3/uL (ref 0.0–0.1)
Basophil %: 0.1 %
Eosinophil #: 0 10*3/uL (ref 0.0–0.7)
Eosinophil %: 0 %
HCT: 32.3 % — ABNORMAL LOW (ref 35.0–47.0)
HGB: 10.4 g/dL — ABNORMAL LOW (ref 12.0–16.0)
LYMPHS PCT: 27.9 %
Lymphocyte #: 1.1 10*3/uL (ref 1.0–3.6)
MCH: 31 pg (ref 26.0–34.0)
MCHC: 32.1 g/dL (ref 32.0–36.0)
MCV: 97 fL (ref 80–100)
MONO ABS: 0.1 x10 3/mm — AB (ref 0.2–0.9)
MONOS PCT: 1.7 %
NEUTROS ABS: 2.8 10*3/uL (ref 1.4–6.5)
Neutrophil %: 70.3 %
PLATELETS: 241 10*3/uL (ref 150–440)
RBC: 3.35 10*6/uL — ABNORMAL LOW (ref 3.80–5.20)
RDW: 13 % (ref 11.5–14.5)
WBC: 4 10*3/uL (ref 3.6–11.0)

## 2014-06-17 LAB — BASIC METABOLIC PANEL
Anion Gap: 11 (ref 7–16)
BUN: 36 mg/dL — ABNORMAL HIGH (ref 7–18)
CALCIUM: 8.8 mg/dL (ref 8.5–10.1)
CHLORIDE: 114 mmol/L — AB (ref 98–107)
CO2: 18 mmol/L — AB (ref 21–32)
CREATININE: 1.81 mg/dL — AB (ref 0.60–1.30)
EGFR (African American): 36 — ABNORMAL LOW
GFR CALC NON AF AMER: 30 — AB
GLUCOSE: 154 mg/dL — AB (ref 65–99)
OSMOLALITY: 296 (ref 275–301)
Potassium: 4.8 mmol/L (ref 3.5–5.1)
SODIUM: 143 mmol/L (ref 136–145)

## 2014-06-17 LAB — HEMOGLOBIN A1C: HEMOGLOBIN A1C: 5.4 % (ref 4.2–6.3)

## 2014-06-17 LAB — MAGNESIUM: Magnesium: 1.8 mg/dL

## 2014-06-18 LAB — URINE CULTURE

## 2014-07-06 ENCOUNTER — Encounter: Payer: Self-pay | Admitting: General Surgery

## 2014-12-25 NOTE — H&P (Signed)
PATIENT NAME:  Brandy George, MANCIL MR#:  [id number] DATE OF BIRTH:  06-25-1946  DATE OF ADMISSION:  06/27/2013  ADMISSION DIAGNOSIS: Acute appendicitis.   SECONDARY DIAGNOSES: Depressive disorder, diabetes mellitus, essential hypertension, gastroesophageal reflux, hypercholesterolemia, hypothyroidism, intervertebral disk syndrome with radiculopathy, previous total joint replacement, history of renal insufficiency.   This 69 year old woman reports that she was in her usual health until about 3 weeks ago when she noticed increased frequency of morning bowel movements. She would have 4 to 5 stools, slightly formed to start the day, but becoming looser as the morning went on. No blood, mucus or unusual cramping. Past history of similar episodes with a diagnosis of "IBS."   The patient denies any fever or chills or abdominal pain during that time. She was well until the early afternoon of October 22 when she noted  some discomfort in the right lower quadrant. This was enough to bring her home from her shopping trip early. She rested that day and slept well that night. The following morning she had breakfast without difficulty, but as the day went on she noticed increasing pain. She had an episode of emesis on the afternoon of 10/23 at 5:00 p.m. and again at 8:00 p.m. Bile was reported. No blood in the vomitus. Her pain progressed over the night and she presented to her primary physician this morning, who referred her to the ED for assessment. She has been evaluated by Lenise Arena, MD, including a CT scan of the abdomen showing evidence of acute appendicitis. She is admitted now for management.   MEDICATIONS: Regular medications consist of allopurinol 200 mg daily, Armour Thyroid 180 mg daily, hydrocodone/Tylenol p.r.n. for pain, lisinopril/hydrochlorothiazide 20/25 daily, Lopid 600 mg daily, omeprazole 40 mg b.i.d., simvastatin 20 mg daily, Maxzide 37.5/25 daily, Effexor 37.5 b.i.d., Xanax 0.25 b.i.d.    PHYSICAL EXAMINATION: Was completed in the Emergency Department.  PRESENTING VITAL SIGNS: Showed temperature 99.3, pulse 104, blood pressure 104/59 and saturation 95%.  HEAD AND NECK: Unremarkable. No thyromegaly or cervical adenopathy.  CHEST: Clear to auscultation.  CARDIAC: Regular rhythm without murmur or gallop.  ABDOMEN: Obese. Decreased bowel sounds. Exquisite right lower quadrant tenderness. No referred pain. No inguinal adenopathy.  EXTREMITIES: Femoral pulses were intact.  Dorsalis pedal pulses 1+, Posterior tibial 0+. No peripheral edema.   RADIOLOGICAL DATA: CT scan was reviewed showing evidence of an enlarged appendix and periappendiceal inflammation. No evidence of obstruction.   LABORATORY STUDIES: Showed a hemoglobin of 11.5 (improved from earlier this year after her hip replacement), white blood cell count 16,100. Minimal elevation in alkaline phosphatase at 147, likely secondary to her recent femur surgery. Elevated creatinine at 1.64, up from baseline of 1.2 and slightly decreased sodium 135, chloride 108, CO2 of 18 and an estimated GFR of 32. Blood sugar 127.   IMPRESSION: Acute appendicitis.   PLAN: Indications for appendectomy were reviewed. The plan would be for a laparoscopic procedure but open procedure if necessary.   This has been discussed with the patient and her family.   ____________________________ Robert Bellow, MD [email or web address] D: 06/27/2013 15:01:22 ET T: 06/27/2013 15:36:51 ET JOB#: [id number]  cc: Robert Bellow, MD, <Dictator> Lenard Simmer, MD Siya Flurry Amedeo Kinsman MD ELECTRONICALLY SIGNED 06/27/2013 16:19

## 2014-12-25 NOTE — Consult Note (Signed)
PATIENT NAME:  Brandy George, Brandy George MR#:  [id number] DATE OF BIRTH:  1945-10-20  DATE OF CONSULTATION:  01/23/2013  REFERRING PHYSICIAN: Laurene Footman, MD CONSULTING PHYSICIAN:  Albertine Patricia, MD  PRIMARY CARE PHYSICIAN: Lenard Simmer, MD  REASON FOR MEDICAL CONSULTATION: Fever, hypoxia, hypotension.   HISTORY OF PRESENT ILLNESS: This is a 69 year old female with significant past medical history of depression, diabetes, hyperlipidemia, hypertension, hypothyroidism, admitted on May 20th for right side total hip replacement revision. Today is the patient's postoperative day #2.  The patient was noticed to have fever of 101.5. As well, she was hypotensive, at one point, systolic blood pressure of 83/54. As well, she was hypoxic, requiring oxygen 2 liters nasal cannula. The patient did receive multiple fluid boluses, and despite that, her blood pressure remained on the lower side. As well, the patient was noticed still to be on her blood pressure medication, which was lisinopril/hydrochlorothiazide and hydrochlorothiazide/triamterene. The patient denies any complaints of chest pain or shortness of breath. She would not have her physical therapy yesterday due to her hypotension. The patient's hemoglobin was noticed to drop from 12.29 to 8.1 in the morning labs, and this was related mainly to blood loss from surgery. As well, the patient was noticed to be in acute renal failure with creatinine of 1.66 today, where it was 1.09 in preoperative evaluation as well. The patient had chest x-ray done STAT, which did show right lower lung infiltrate. The patient denies any recent vomiting, complaining of nausea, but she reports she did have 1 episodes of vomiting during her postoperative course in PACU. As well, the patient's urinalysis came back positive for leukocyte esterase and white blood cells in the urine.   PAST MEDICAL HISTORY:  1. Gastroesophageal reflux disease.  2. Depression.  3.  Diabetes.  4. Hyperlipidemia.  5. Hypertension.  6. Thyroid problem.  7. Cataract.   PAST SURGICAL HISTORY:  1. Cholecystectomy.  2. Foot surgery.  3. Hysterectomy.  4. Knee arthroscopy on the right 2006.  5. Left total knee replacement.  6. Lumbar decompression and posterior lateral fusion of L4-L5, L5-S1 in 2004.  7. Right total knee replacement.  8. Uncemented right total hip replacement in 2011.   SOCIAL HISTORY: The patient denies any history of smoking. Married, lives at home. She is a retired Customer service manager. No alcohol or illicit drug use.   FAMILY HISTORY: Significant for diabetes, coronary artery disease and hypertension in the family.   ALLERGIES:  1. MORPHINE. 2. TAPE.   HOME MEDICATIONS:  1. Calcium with vitamin D 1 tablet daily.  2. Gemfibrozil 600 mg oral daily.  3. Multivitamin 1 tablet oral daily.  4. Triamterene/hydrochlorothiazide 37.5/25 one capsule daily.  5. Lisinopril/hydrochlorothiazide 20/25 one tablet daily.  6. Armour Thyroid 180 mg oral daily.  7. Victoza 1.2 mcg subcutaneous daily.  8. B complex vitamin 1 tablet daily.  9. Omeprazole 20 mg daily.  10. Norco as needed.   REVIEW OF SYSTEMS:  CONSTITUTIONAL: The patient complains of fever, generalized weakness and fatigue.  EYES: Denies blurry vision, double vision, pain, inflammation.  ENT: Denies tinnitus, ear pain, hearing loss, epistaxis or discharge.   RESPIRATORY: Complains of cough, feels congestion, but she cannot cough up sputum. Denies any wheezing, hemoptysis or COPD.  CARDIOVASCULAR: Denies chest pain, edema, arrhythmia, palpitations, syncope.  GASTROINTESTINAL: Complains of nausea. Had 1 episode of vomiting. Denies any abdominal pain, hematemesis, melena, rectal bleed or coffee-ground emesis.  GENITOURINARY: Has Foley catheter. Denies any renal colic.  ENDOCRINE: Denies polyuria, polydipsia, heat or cold intolerance.  HEMATOLOGY: Denies easy bruising, bleeding diathesis.  INTEGUMENTARY:  Denies acne, rash or skin lesions.  MUSCULOSKELETAL: Denies neck pain, shoulder pain, arthritis, cramps or gout.  NEUROLOGIC: Denies CVA, TIA, seizures, memory loss, ataxia, vertigo, tremors.  PSYCHIATRIC: Denies schizophrenia, substance abuse, alcohol abuse, insomnia.   PHYSICAL EXAMINATION:  VITAL SIGNS: Temperature 98.6, pulse 119, respiratory rate 20, blood pressure 87/57, saturating 95% on 2 liters nasal cannula. T-max was 101.5 at 2:00 a.m.  GENERAL: An elderly female who looks comfortable, in no apparent distress.  HEENT: Head atraumatic, normocephalic. Pupils equal and reactive to light. Pink conjunctivae. Anicteric sclerae. Moist oral mucosa.  NECK: Supple. No thyromegaly. No JVD.  CHEST: The patient had good air entry bilaterally. No wheezing. There are rales appreciated in the right lower lung. No use of accessory muscles.  CARDIOVASCULAR: S1, S2 heard. No rubs, murmur or gallops. Tachycardic.  ABDOMEN: Soft, nontender, nondistended. Bowel sounds present.  EXTREMITIES: Has sequential compression device bilaterally. Minimal pitting edema. No clubbing. No cyanosis. Dorsalis pedis pulse +2.  SKIN: Has right wound VAC in the right groin area on the surgical site.  PSYCHIATRIC: Appropriate affect. Awake and alert x3. Intact judgment and insight.  NEUROLOGIC: Cranial nerves grossly intact. No focal motor or sensory deficits.   PERTINENT LABORATORY DATA: Glucose 90, BUN 23, creatinine 1.66, sodium 137, potassium 4.2, chloride 111, CO2 20. White blood cells 8.5, hemoglobin 8.1, hematocrit 23.6, platelets 122. Urinalysis showing 5 white blood cells and leukocyte esterase +1. Chest x-ray: Initial evaluation by me showing right lower lung infiltrate, still awaiting official reading.   ASSESSMENT AND PLAN: This is a 69 year old female status post right total hip replacement revision, postoperative day 2. Medical consult was requested for hypoxia, hypotension and fever. The patient does have  evidence of urinary tract infection and pneumonia in the right lower lung.   1. Sepsis. The patient is febrile, hypotensive, hypoxic. Source of infection can be related to urinary tract infection versus pneumonia. As well, will have the patient on strict ins and outs. Thus, I will keep her Foley for another 24 hours for more accurate measurement. Will send septic workup, including blood cultures and sputum cultures.  2. Pneumonia. The patient has right lower lung infiltrate. It might be aspiration pneumonia, and she did have 1 episode of vomiting in PACU. As well, we will treat for healthcare-acquired pneumonia as well. The patient will be started on IV vancomycin and Zosyn. Will send blood cultures. Will send sputum cultures as well.  3. Urinary tract infection. The patient is on IV Zosyn. Will follow urine culture.  4. Acute renal failure. This is most likely prerenal given the patient's hypotension and anemia. Will hold the patient's lisinopril, will hold hydrochlorothiazide, will hold triamterene. Will continue with aggressive IV fluid hydration. The patient does not appear to be in any volume overload, so will continue with fluid boluses as needed.  5. Hypotension. This is related to the patient's sepsis and volume depletion. Will stop her hypertension medication. Will continue with hydration.  6. Anemia. The patient has significant drop in her hemoglobin to 8.1. This is most likely related to her blood loss from surgery as well as significant drop from 9 to 8.1 as of yesterday. This is most likely related to dilution as the patient received significant volume over the last 24 hours. Will monitor closely. Will transfuse if needed. Will add type and screen to tomorrow's lab in case she needs to be  transfused. Has no complaints of melena or bright red blood per rectum or coffee-ground emesis. Will start her on iron supplement.  7. Thrombocytopenia. Will monitor closely. The patient did not receive any  Lovenox or any heparin products to suspect HIT antibody.  8. Diabetes mellitus. Will continue with fingersticks before meals. Blood sugar seems to be controlled without any medications. If becomes uncontrolled, will start her on insulin sliding scale.  9. Hyperlipidemia. Continue with gemfibrozil.  10. Hypertension. The patient is hypotensive, so will discontinue her hypertensive medications.  11. Deep vein thrombosis prophylaxis. The patient is on Xarelto.   TOTAL TIME SPENT ON MEDICAL CONSULT: 60 minutes.   ____________________________ Albertine Patricia, MD dse:OSi D: 01/23/2013 06:13:33 ET T: 01/23/2013 06:35:06 ET JOB#: [id number]  cc: Albertine Patricia, MD, <Dictator> Khila Papp Graciela Husbands MD ELECTRONICALLY SIGNED 01/25/2013 4:26

## 2014-12-25 NOTE — Discharge Summary (Signed)
PATIENT NAME:  Brandy George, JOSHUA MR#:  [id number] DATE OF BIRTH:  1946/06/27  DATE OF ADMISSION:  01/21/2013 DATE OF DISCHARGE:  01/24/2013  ADMITTING DIAGNOSIS: Painful total right hip.   DISCHARGE DIAGNOSIS: Painful total right hip.   PROCEDURE: Revision of right total hip femoral component.   ANESTHESIA: Spinal.   SURGEON: Laurene Footman, M.D.   ASSISTANT: Rachelle Hora, PA-C.   ESTIMATED BLOOD LOSS: 650 mL, Cell Saver 325 mL.   IV FLUIDS: 3600.   IMPLANTS: Dall-Miles cable 1.6 mm x 1, Medacta Quadra R size 1 stem, size M 28 mm head with a Restoration ADM insert, 28 x 54.   COMPLICATIONS:  None.  HISTORY: The patient is a 69 year old who has had a history of prior lumbar fusion for L4-L5 spondylolisthesis that apparently has done well with prior x-rays showing no evidence of gross motion on flexion-extension views. She is still having a great deal of pain in the hip and pain with rotation of the right hip. Has difficulty with lying flat with the right hip in extension. While supine she has had severe pain anteriorly and with any range of motion she has severe pain. Previous lab work has shown increased metal ions. She has had trouble lifting her leg when driving, on the right side. She additionally has had bilateral total knee replacements. X-rays and treatment options were reviewed at length with her. Her total hip replacement was on 03/29/2010 by Dr. Mauri Pole with a Restoration ADM 54 mm carbon Rejuvenate SPT #8 stem with a 2854 insert plus 2028 mm head and 132 degree 38 mm neck. The patient was informed that this could be a difficult revision and we thought that this could be done anteriorly. The patient agreed to the risks and benefits.   PHYSICAL EXAMINATION: LUNGS: Clear to auscultation.  HEART: Regular rate and rhythm.  HEENT: Unremarkable. RIGHT HIP:  Painful with hip flexion, internal and external rotation. No swelling, warmth, or erythema.  NEUROVASCULAR: Intact in the  right lower extremity.   HOSPITAL COURSE: The patient was admitted to the hospital on 01/21/2013. She had surgery that same day and was brought to the orthopedic floor from the PAC-U in stable condition. On postoperative day 1, the patient was found to have a hemoglobin of 9.0. She was hypotensive and feeling dizzy with standing. The patient was given 1 transfusion of 1 unit of packed red blood cells. On 01/23/2013, the patient was found to have a fever of 101.5. She was hypotensive and medicine was consulted. Medicine started the patient on IV vanc and Zosyn and ordered blood cultures as well as sputum cultures. She also was found to have acute renal failure with a creatinine of 1.66. We continued with fluids. Her hypertension medications were held due to hypotension and on postop day 3 the patient had been afebrile for 24 hours, her blood pressure had been resolved, and she was no longer having any dizziness or lightheadedness with standing. The patient's blood pressure medications were continued to be on hold. The patient was doing well on postop day 3. She continued to have slow progress with physical therapy and we agreed for the patient to go to rehab facility on postop day 3.   CONDITION AT DISCHARGE: Stable.   DISCHARGE INSTRUCTIONS: The patient may gradually increase weight-bearing on the affected extremity. She needs to wear thigh-high TED hose on both legs and remove 1 hour per 8 hour shift, elevate heels off the bed, and use incentive spirometry every hour  while awake, and encourage cough and deep breathing. She may resume a regular diet as tolerated and apply an ice pack to the affected area. Do not get the dressing bandage wet or dirty. Call [facility] orthopedics if the dressing gets water under it. Leave the dressing on. Call [facility] orthopedics if any of the following occur: Bright red bleeding from the incision wound, fever above 101.5 degrees, redness, swelling, or drainage at  the incision. Call [facility] orthopedics if you experience any increased leg pain, numbness or weakness in legs or bowel or bladder symptoms. She is referred to physical therapy, occupational therapy at the rehab facility. She is to follow-up with [facility] orthopedics in 2 weeks.   DISCHARGE MEDICATIONS: 1.  Gemfibrozil 600 mg oral tablet 1 tablet orally once a day in the morning. 2.  Armour thyroid 180 mg oral tablet 1 tablet orally once a day in the morning.  3.  Multivitamin 1 tab orally once a day at bedtime.  4.  Vitamin D3 1000 international units oral tablet 1 tablet orally once a day in the morning.  5.  Super B complex 1 tablet orally once a day in the morning. 6.  Allopurinol 100 mg oral tablet 2 tabs orally once a day in the morning.  7.  Omeprazole 40 mg oral delayed-release capsule 1 cap orally once a day in the morning.  8.  Alprazolam 0.25 mg oral tablet 1 tablet orally 2 times a day.  9.  Simvastatin 20 mg oral tablet 1 tablet orally once a day at bedtime. 10.  Tylenol 500 mg oral tablet 1 tablet orally every 4 hours as needed for pain or temp greater than 100.4. 11.  Oxycodone 5 mg oral tablet 1 tab orally every 4 hours as needed for pain. 12.  Nucynta 50 mg oral tablet 1 tablet orally every 4 hours as needed for pain. 13.  Magnesium hydroxide 8% oral suspension 30 mL orally 2 times a day as needed for constipation.  14.  Aluminum hydroxide/magnesium hydroxide/simethicone 400 mg/400 mg/40 mg/5 mL oral suspension 30 mL orally every 6 hours as needed for indigestion or heartburn.  15.  Xarelto 10 mg oral tablet 1 tablet orally once a day in the morning.  16.  Ferrous sulfate 325 mg oral tablet 1 tablet orally 2 times a day with meals.  17.  Bisacodyl 10 mg rectal suppository 1 suppository rectal once a day as needed for constipation.  18.  Docusate/senna 50/8.6 oral tablet 1 tablet orally 2 times a day.  ____________________________ Duanne Guess, PA-C tcg:sb D: 01/24/2013  07:52:34 ET T: 01/24/2013 08:10:29 ET JOB#: [id number]  cc: Duanne Guess, PA-C, <Dictator> Duanne Guess [place] ELECTRONICALLY SIGNED 02/09/2013 2:37

## 2014-12-25 NOTE — Consult Note (Signed)
Chief Complaint:  Subjective/Chief Complaint 69 yr old female with hypotension,fever/acute renal failure.seen by Dr.Elgergawy this am. pt seen again.says she is doing lot better now.no cough.good po intake than yesterday.   VITAL SIGNS/ANCILLARY NOTES: **Vital Signs.:   22-May-14 10:01  Vital Signs Type Routine  Temperature Temperature (F) 98.4  Celsius 36.8  Temperature Source oral  Pulse Pulse 100  Respirations Respirations 17  Systolic BP Systolic BP 967  Diastolic BP (mmHg) Diastolic BP (mmHg) 66  Mean BP 79  Pulse Ox % Pulse Ox % 98  Pulse Ox Activity Level  At rest  Oxygen Delivery Room Air/ 21 %   Brief Assessment:  Cardiac Regular  -- LE edema   Respiratory normal resp effort  clear BS   Gastrointestinal Normal   Gastrointestinal details normal Soft  Bowel sounds normal   Lab Results: Routine Chem:  21-May-14 05:22   Glucose, Serum  114  BUN  30  Creatinine (comp)  1.62  Sodium, Serum 141  Potassium, Serum 4.5  Chloride, Serum  114  CO2, Serum  20  Calcium (Total), Serum 8.5  Anion Gap 7  eGFR (African American)  38  eGFR (Non-African American)  33 (eGFR values <15mL/min/1.73 m2 may be an indication of chronic kidney disease (CKD). Calculated eGFR is useful in patients with stable renal function. The eGFR calculation will not be reliable in acutely ill patients when serum creatinine is changing rapidly. It is not useful in  patients on dialysis. The eGFR calculation may not be applicable to patients at the low and high extremes of body sizes, pregnant women, and vegetarians.)  22-May-14 05:10   Glucose, Serum 90  BUN  23  Creatinine (comp)  1.66  Sodium, Serum 137  Potassium, Serum 4.2  Chloride, Serum  111  CO2, Serum  20  Calcium (Total), Serum 8.5  Anion Gap  6  Osmolality (calc) 277  eGFR (African American)  37  eGFR (Non-African American)  32 (eGFR values <87mL/min/1.73 m2 may be an indication of chronic kidney disease (CKD). Calculated  eGFR is useful in patients with stable renal function. The eGFR calculation will not be reliable in acutely ill patients when serum creatinine is changing rapidly. It is not useful in  patients on dialysis. The eGFR calculation may not be applicable to patients at the low and high extremes of body sizes, pregnant women, and vegetarians.)  Routine UA:  22-May-14 05:00   Color (UA) Yellow  Clarity (UA) Clear  Glucose (UA) Negative  Bilirubin (UA) Negative  Ketones (UA) Negative  Specific Gravity (UA) 1.014  Blood (UA) Negative  pH (UA) 5.0  Protein (UA) Negative  Nitrite (UA) Negative  Leukocyte Esterase (UA) 1+ (Result(s) reported on 23 Jan 2013 at 05:17AM.)  RBC (UA) 1 /HPF  WBC (UA) 5 /HPF  Bacteria (UA) 1+  Epithelial Cells (UA) NONE SEEN  Result(s) reported on 23 Jan 2013 at 05:17AM.  Routine Hem:  21-May-14 05:22   Hemoglobin (CBC)  9.0 (Result(s) reported on 22 Jan 2013 at 05:47AM.)  Platelet Count (CBC)  133 (Result(s) reported on 22 Jan 2013 at 05:47AM.)  22-May-14 05:10   WBC (CBC) 8.5  RBC (CBC)  2.49  Hemoglobin (CBC)  8.1  Hematocrit (CBC)  23.6  Platelet Count (CBC)  122  MCV 95  MCH 32.6  MCHC 34.4  RDW 13.2  Neutrophil % 71.0  Lymphocyte % 15.1  Monocyte % 12.3  Eosinophil % 1.1  Basophil % 0.5  Neutrophil # 6.0  Lymphocyte #  1.3  Monocyte #  1.0  Eosinophil # 0.1  Basophil # 0.0 (Result(s) reported on 23 Jan 2013 at 05:31AM.)   Assessment/Plan:  Assessment/Plan:  Assessment 32 yr 21 female s/p rioght hip surgery POD 2 slightly tachy;pain meds as BP tolerates DVT prophylaxis,PT  2.pneumonia  likely aspiration;on zosyn,follow cultures.follow fever curve,oxygentation better today. 3.acute renal failure due to hypoitensionwith ATN;hold bp meds,contineu fluids,follwo UOP,BMp am.avoid nephrotoxic  agents. will follow   Plan time spent 20 min   Electronic Signatures: Epifanio Lesches (MD)  (Signed 22-May-14 13:23)  Authored: Chief Complaint,  VITAL SIGNS/ANCILLARY NOTES, Brief Assessment, Lab Results, Assessment/Plan   Last Updated: 22-May-14 13:23 by Epifanio Lesches (MD)

## 2014-12-25 NOTE — Op Note (Signed)
PATIENT NAME:  Brandy George, Brandy George MR#:  [id number] DATE OF BIRTH:  [birth year]  DATE OF PROCEDURE:  01/21/2013  PREOPERATIVE DIAGNOSIS: Painful right total hip.   POSTOPERATIVE DIAGNOSIS: Painful right total hip.   PROCEDURE: Revision right total hip femoral component.   ANESTHESIA: Spinal.   SURGEON: Laurene Footman, M.D.   ASSISTANT:  Rachelle Hora, PA-C.   DESCRIPTION OF PROCEDURE: The patient was brought to the Operating Room and after adequate anesthesia was obtained, the patient was placed on the operative table with the right leg in the Medacta attachment, left leg in a well legholder after preprocedure pictures obtained. After adequate visualization of both hips had been obtained, the hip was prepped and draped in the usual sterile fashion.  After appropriate patient identification and timeout procedures were completed, anterior approach was made to the hip, centered over the greater trochanter with aid of x-ray for localization. Skin incision was made getting down to the tensor muscle. Tensor fascia muscle belly was incised and the muscle retracted laterally. Deep fascia then incised and the rectus fascia visualized.  Anterior femoral circumflex vessels identified, ligated with 0 silk ties and the anterior capsule then exposed. A capsulotomy was then carried out with two sets of cultures obtained and a deep Charnley retractor placed. With adequate release of the capsule, the hip could be dislocated in the femoral head along with trunnion were removed without difficulty. There did appear to have some fretting the neck attachment to the stem. The hip was then gently externally rotated further and posterior capsular release carried out to both femoral and ischiofemoral ligaments to allow for adequate mobilization of the proximal femur.  The leg was placed in further external rotation with care of her total knee not to cause fracture.   The leg was then extended and adducted and the proximal  femur thus exposed. The soft tissue attachment surrounding the implant were debrided and adequate exposure of the proximal femur was obtained. Thin flexible osteotomes were used to try to separate the implant from the bone; however, there was very stable fixation of the implant to the bone and so a small saw was used to make a medial cut in the calcar to allow for opening of the canal and removal of the implant.  Thin flexible osteotomes were used and the bone was essentially separated off the implant with these thin flexible osteotomes. The stem was then removed with a small amount of bone attached. The canal was then prepared for a revision stem with first placement of a guidewire and sequential reaming to get out the prior scar tissue around the distal portion of the prior implant. Following this, broaching was carried out to a #1 Medacta Quadra-R stem in the appropriate rotation. Trial components were placed and good length and position of the implants was felt to have been obtained.   The #1 stem was then impacted with good rotational stability with a repeat trial carried out and subsequently the M-28 mm head and from [place] with restoration ADMX3 insert, internal diameter 28 mm, external diameter 54 mm, size 48-G.  These components were assembled on the back table, placed onto the stem with impacted and then reduced. A cerclage wire was then placed around the trochanters to aid in fixation and to get a tight fit around the proximal portion of the implant. This was tightened, crimped and the excess wire cut. The hip appeared stable through range of motion and with external rotation. It was thoroughly irrigated. The  capsule was repaired using an 0 Ethibond, a heavy quill suture was used for the deep fascia, 2-0 quill subcutaneously, skin staples, and then a wound VAC with Mepitel over the incision to minimize postoperative hematoma formation. Cell Saver was used during the procedure with 1 unit of blood given  with reinfused blood.   ESTIMATED BLOOD LOSS: 650.  CELL SAVER:  325.   IV FLUIDS: 3600.   IMPLANTS: Dall-Miles cable 1.6 mm x 1, Medacta Quadra-R size 1 stem, size M 28 mm head with a restoration ADM insert 28/54.  There were no complications.   SPECIMEN:  A culture and some portion of the scar tissue around the joint with the implants being sent off to an outside lab.  Condition to recovery room is stable.    ____________________________ Laurene Footman, MD mjm:rw D: 01/21/2013 18:02:33 ET T: 01/21/2013 19:12:25 ET JOB#: [id number]  cc: Laurene Footman, MD, <Dictator> Laurene Footman MD ELECTRONICALLY SIGNED 01/22/2013 6:42

## 2014-12-25 NOTE — Op Note (Signed)
PATIENT NAME:  Brandy George, Brandy George MR#:  [id number] DATE OF BIRTH:  03-13-1946  DATE OF PROCEDURE:  06/27/2013  PREOPERATIVE DIAGNOSIS: Acute appendicitis.   POSTOPERATIVE DIAGNOSIS: Acute appendicitis with focal abscess.   OPERATIVE PROCEDURE: Laparoscopic appendectomy.   SURGEON: Robert Bellow, MD  ANESTHESIA: General endotracheal under Dr. Boston Service.   ESTIMATED BLOOD LOSS: 10 mL.   CLINICAL NOTE: This 69 year old woman presented with a 2-day history of abdominal pain. She showed exquisite right lower quadrant tenderness. CT scan showed evidence of appendicitis with local phlegmon. She was felt to be a candidate for appendectomy.   OPERATIVE NOTE: The patient received Mefoxin after induction of anesthesia. She had previously received a dose of Unasyn in the Emergency Room approximately 6 hours earlier. The abdomen was prepped with ChloraPrep and draped. In Trendelenburg position, a Veress needle was placed through a transumbilical incision. After assuring intra-abdominal location with the hanging drop test, the abdomen was insufflated with CO2 at 10 mmHg pressure. A 10 mm step-port was expanded. Inspection showed no evidence of injury from initial port placement. There was evidence of a marked inflammatory process in the right lower quadrant. A hypogastric 10 mm step-port was placed under direct vision. It was possible to identify the terminal ileum and cecum. This was immediately adjacent to a markedly inflamed appendix with a small fluid collection of purulent fluid. During manipulation of the appendix, the appendicolith broke out through the base of the appendix about 5 mm above the cecum. A 12 mm XCEL port was placed in the epigastrium. An Endo GIA blue stapler was able to be passed across the cecum and provided good closure of the cecal appendiceal stump. The mesoappendix was taken down with application of a white GIA cartridge. Several cartridges were used as the first two would  not fire properly. The appendix was placed into an Endo Catch bag as was the appendicolith. This was delivered through the umbilical port site. After re-establishing pneumoperitoneum, the abdomen was irrigated with a liter of lactated Ringer's solution. Good hemostasis was appreciated. The cecal stump appeared intact. The abdomen was then desufflated under direct vision after closure of the umbilical port with an 0 Maxon suture.   Skin incisions were closed with 4-0 Vicryl. Benzoin and Steri-Strips followed by Telfa and Tegaderm dressings were applied.   The patient tolerated the procedure well and was taken to the recovery room in stable condition.   ____________________________ Robert Bellow, MD jwb:lb D: 06/28/2013 08:58:00 ET T: 06/28/2013 09:21:27 ET JOB#: [id number]  cc: Robert Bellow, MD, <Dictator> Lenard Simmer, MD Vicki Chaffin Amedeo Kinsman MD ELECTRONICALLY SIGNED 06/30/2013 10:07

## 2014-12-25 NOTE — Discharge Summary (Signed)
PATIENT NAME:  Brandy George, Brandy George MR#:  [id number] DATE OF BIRTH:  [birth year]  DATE OF ADMISSION:  06/29/2013 DATE OF DISCHARGE:  07/06/2013  DISCHARGE DIAGNOSES:  1.  Acute appendicitis.  2.  Postoperative ileus.  3.  Postoperative diarrhea.   CLINICAL NOTE: This 69 year old woman presented with a 2-day history of abdominal pain. She presented to the Emergency Department and CT scan showed evidence of a non-drainable phlegmon in the right lower quadrant as well as acute appendicitis. She was taken to the operating room the evening of admission at which time she underwent a laparoscopic appendectomy. The inflammatory process was confined to the right lower quadrant. The patient's postoperative course was notable for abdominal distention and nausea with vomiting. Plain films suggested an early partial small bowel obstruction. Plain films showed a distended stomach and her symptoms markedly improved with placement of a nasogastric tube. She was encouraged ambulation. She maintained a clear cardiopulmonary exam. Her NG drainage fell and her tube was subsequently removed. During this time, the patient had a urinalysis from admission showing evidence of multiple WBCs. Voided specimen showed rare WBCs and cath specimen still showed 12 WBCs, again she had no urinary symptoms of dysuria. She was being treated with ertapenem for 5 days for her appendicitis and additional antibiotic therapy was not felt indicated.   The patient had significant loose stools after her small bowel obstruction was relieved and this was thought secondary to passage of succus entericus.   At the time of discharge, the patient was ambulating well, was afebrile and tolerating a soft diet.   DISCHARGE MEDICATIONS: Consists of: 1.  Alprazolam 0.25 mg b.i.d. p.r.n.  2.  Simvastatin 20 mg at bedtime.  3.  Omeprazole 40 mg b.i.d.  4.  Lisinopril/hydrochlorothiazide 25/20 daily. 5.  Lopid 600 mg daily. 6.  Allopurinol 100 mg  daily. 7.  Desiccated thyroid 180 mg daily.   Arrangements were made for followup in my office in 1 to 2 weeks.  ____________________________ Robert Bellow, MD jwb:aw D: 07/21/2013 13:41:31 ET T: 07/21/2013 14:16:02 ET JOB#: [id number]  cc: Robert Bellow, MD, <Dictator> Lenard Simmer, MD Jamesyn Lindell Amedeo Kinsman MD ELECTRONICALLY SIGNED 07/21/2013 16:44

## 2014-12-26 NOTE — H&P (Signed)
PATIENT NAME:  Brandy George, Brandy George MR#:  [id number] DATE OF BIRTH:  24-Nov-1945  DATE OF ADMISSION:  06/16/2014  PRIMARY CARE PHYSICIAN:  Lenard Simmer, MD.  CHIEF COMPLAINT:  Swollen tongue and dysphagia.   HISTORY OF PRESENT ILLNESS: A 69 year old female with a history of hypertension, diabetes, hyperlipidemia, who presented to the ED with chief complaints. The patient is alert, awake, oriented, in no acute distress. The patient said she was fine until this morning. The patient feels dysphagia and noticed tongue swelling, so patient came to the ED for further evaluation. In ED, the patient was noted to have a swollen tongue but no shortness of breath, no chest pain, palpitation, or leg swelling. The patient denies any abdominal pain, nausea, or vomiting. Denies any rash or bleeding. The patient has been taking HCTZ/lisinopril for more than 10 years. She said that she has been drinking alcohol, about 1 glass of wine daily, but did not start any new medications.   PAST MEDICAL HISTORY: Hypertension, diabetes, hyperlipidemia, hypothyroidism, GERD,  migraines, arthritis.   SOCIAL HISTORY: No smoking, but drinks alcohol daily, 1 glass of wine. No drug abuse.   PAST SURGICAL HISTORY: Foot surgery, bilateral knee replacement, cholecystectomy, appendectomy, thyroidectomy, hysterectomy, back surgery.   FAMILY HISTORY: Diabetes and heart disease in her mother and brother.  REVIEW OF SYSTEMS: CONSTITUTIONAL: The patient denies any fever or chills. No headache or dizziness. No weakness.  EYES: No double vision, blurry vision.  EARS, NOSE, THROAT: No postnasal drip or slurred speech but has dysphagia and tongue swelling.  CARDIOVASCULAR: No chest pain, palpitation, orthopnea, nocturnal dyspnea. No leg edema.  PULMONARY: No cough, sputum, shortness of breath or hemoptysis.  GASTROINTESTINAL: No abdominal pain, nausea, vomiting, or diarrhea. No melena or bloody stool.  GENITOURINARY: No dysuria,  hematuria, or incontinence.  SKIN: No rash or jaundice.  NEUROLOGY: No syncope, loss of consciousness, or seizure.  ENDOCRINE: No polyuria, polydipsia, heat or cold intolerance.  HEMATOLOGIC: No easy bruising or bleeding.   ALLERGIES: MORPHINE AND AT THIS TIME LISINOPRIL.   HOME MEDICATIONS: Vitamin D3 one thousand International Units 1 tablet once a day, super B complex vitamins 1 tablet once a day, Zocor 20 mg p.o. daily at bedtime, Reclast 5 mg/100 mL intravenous yearly, omeprazole 40 mg p.o. b.i.d., multivitamin 1 tablet daily, Lopid 600 mg p.o. daily, HCTZ/lisinopril 25 mg/20 mg p.o. tablet once a day, Armour Thyroid 180 mg p.o. daily, alprazolam 0.25 mg p.o. b.i.d., allopurinol 100 mg p.o. 2 tablets once a day.   PHYSICAL EXAMINATION:  VITAL SIGNS: Temperature 97.8, blood pressure 120/73, pulse 73, oxygen saturation 96% on room air.  GENERAL: The patient is alert, awake, oriented, in no acute distress.  HEENT: Pupils round, equal and reactive to light and accommodation. Moist oral mucosa but has a swollen tongue, unable to see oropharynx. No lip swelling.   NECK: Supple. No JVD or carotid bruit. No lymphadenopathy. No thyromegaly.  CARDIOVASCULAR: S1 and S2. Regular rate and rhythm. No murmurs, gallops.  PULMONARY: Bilateral air entry. No wheezing or rales. No use of accessory muscle to breathe.  ABDOMEN: Obese, soft. No distention. No tenderness. No organomegaly. Bowel sounds present.  EXTREMITIES: No edema, clubbing or cyanosis. No calf tenderness. Bilateral pedal pulses present.  SKIN: No rash or jaundice.  NEUROLOGIC: Alert and oriented x 3. No focal deficit. Power 5/5. Sensation intact.   LABORATORY DATA: Glucose 96, BUN 35, creatinine 2.01. Patient's baseline last year: BUN was 6, creatinine was 1.4. Sodium 140, potassium 4.8,  chloride 114, bicarbonate 18. WBC 8.1, hemoglobin 10.7, platelets 249. INR 1.0.   Urinalysis shows WBC 19, RBC 2, nitrate negative.   IMPRESSIONS:  1.   Angioedema, possibly caused by lisinopril.  2.  Acute renal failure on chronic kidney disease.  3.  Urinary tract infection.  4.  Anemia.  5.  Hypertension.  6.  Diabetes.  7.  Alcohol abuse.   PLAN OF TREATMENT:  1.  The patient will be placed for observation. We will hold the lisinopril. Start Norvasc. The patient was treated with Solu-Medrol and Benadryl in the ED.  We will continue Solu-Medrol and Benadryl, in addition we will give Zantac and closely monitor the patient's vital signs and oxygen saturations, give oxygen by nasal cannula.  2.  For hypertension, lisinopril is discontinued. We will start Lopressor 25 mg p.o. b.i.d.  3.  For UTI, start rocepin, follow up urine culture.  4.  For diabetes, we will start a sliding scale.  5.  I discussed the patient's condition and plan of treatment with and the patient and the patient's husband.   The patient wants full code.   TIME SPENT: About 55 minutes.    ____________________________ Demetrios Loll, MD qc:lt D: 06/16/2014 12:31:33 ET T: 06/16/2014 12:53:44 ET JOB#: [id number]  cc: Demetrios Loll, MD, <Dictator> Demetrios Loll MD ELECTRONICALLY SIGNED 06/16/2014 15:00

## 2014-12-26 NOTE — Discharge Summary (Signed)
PATIENT NAME:  Brandy George, Brandy George MR#:  [id number] DATE OF BIRTH:  1946-08-28  DATE OF ADMISSION:  06/16/2014 DATE OF DISCHARGE:  06/17/2014  PRIMARY CARE PHYSICIAN: Lenard Simmer, MD   DISCHARGE DIAGNOSES:    1.  ANGIOEDEMA DUE TO ACE INHIBITOR.  2.  Acute renal failure on chronic kidney disease.  3.  Urinary tract infection.  4.  Anemia.  5.  Hypertension.  6.  Diabetes.   CONDITION: Stable.   CODE STATUS: Full Code.   HOME MEDICATIONS: Please refer to the medication list. The patient's hydrochlorothiazide/ lisinopril is discontinued. Instead we will give him Lopressor 25 mg p.o. b.i.d.   ACTIVITY: As tolerated.   DIET: Low-sodium, low-fat, low-cholesterol, ADA diet.   FOLLOWUP CARE: Follow with PCP within 1-2 weeks.   REASON FOR ADMISSION: Tongue swelling.   HOSPITAL COURSE: The patient is a 69 year old Caucasian female with a history of hypertension and diabetes, who presented to the ED with swollen tongue and dysphagia. The patient has been taking lisinopril for many years and never had problems, but the patient developed swollen tongue and some dysphagia 1 day prior of this admission.   For detailed history and physical examination, please refer to the admission note dictated by me yesterday.   Laboratory data showed BUN 35, creatinine 2.01, which is elevated from her baseline. The patient's electrolytes were normal. Hemoglobin 10.7, WBC 8.1. Urinalysis showed WBC 19, RBC 2.   HOSPITAL COURSE:  1.  ANGIOEDEMA POSSIBLY DUE TO LISINOPRIL. The patient's hydrochlorothiazide/ lisinopril is discontinued. In addition, the patient has been treated with Solu-Medrol IV, with  Benadryl and Zantac. The patient's symptoms have much improved. The patient's swollen tongue improved. No dysphagia. No slurred speech. No shortness of breath.  2.  Acute renal failure on CKD. The patient has been treated with IV fluid support. Kidney function improved to 36/1.81.  3.  Urine tract  infection. The patient has no symptoms. She was treated with Rocephin IV for 2 days. We will discontinue Rocephin.  4.  Anemia, stable.  5.  Hypertension, controlled with Lopressor 25 mg p.o. b.i.d.  6.  Diabetes is stable.   The patient has no complaints. Vital signs are stable. She is clinically stable, and will be discharging her to home today.   I discussed the discharge plan with the patient, nurse and case manager.   TIME SPENT: About 35 minutes.     ____________________________ Demetrios Loll, MD qc:MT D: 06/17/2014 11:06:42 ET T: 06/17/2014 16:03:40 ET JOB#: [id number]  cc: Demetrios Loll, MD, <Dictator> Demetrios Loll MD ELECTRONICALLY SIGNED 06/17/2014 20:23

## 2015-05-11 IMAGING — CR DG CHEST 2V
1 series · 2 of 2 positions shown · non-contrast
Comparison: none

REASON FOR EXAM: post op lap appendectomy, ileus, abnormal pulmonary exam.
COMMENTS:

[Series 1: pa · 0.17mm/px · 2 of 2 slices shown]
[im 1/2]
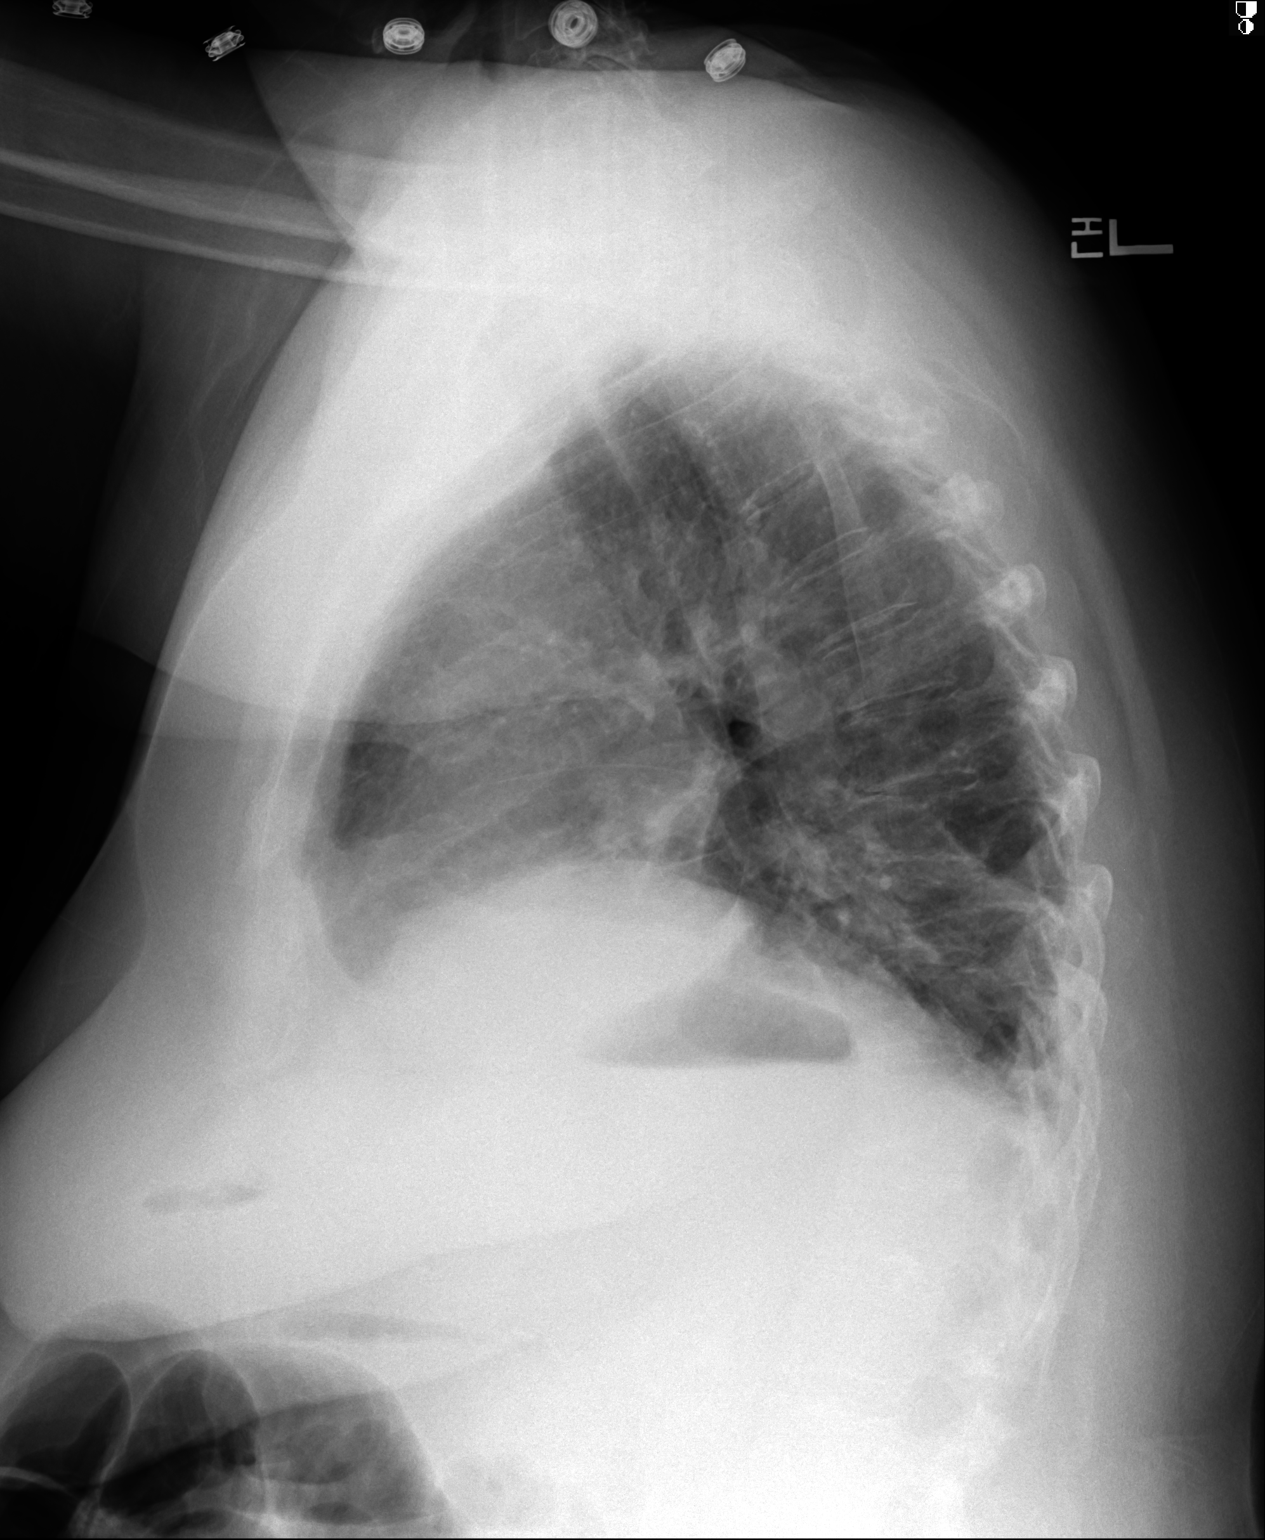
[im 2/2]
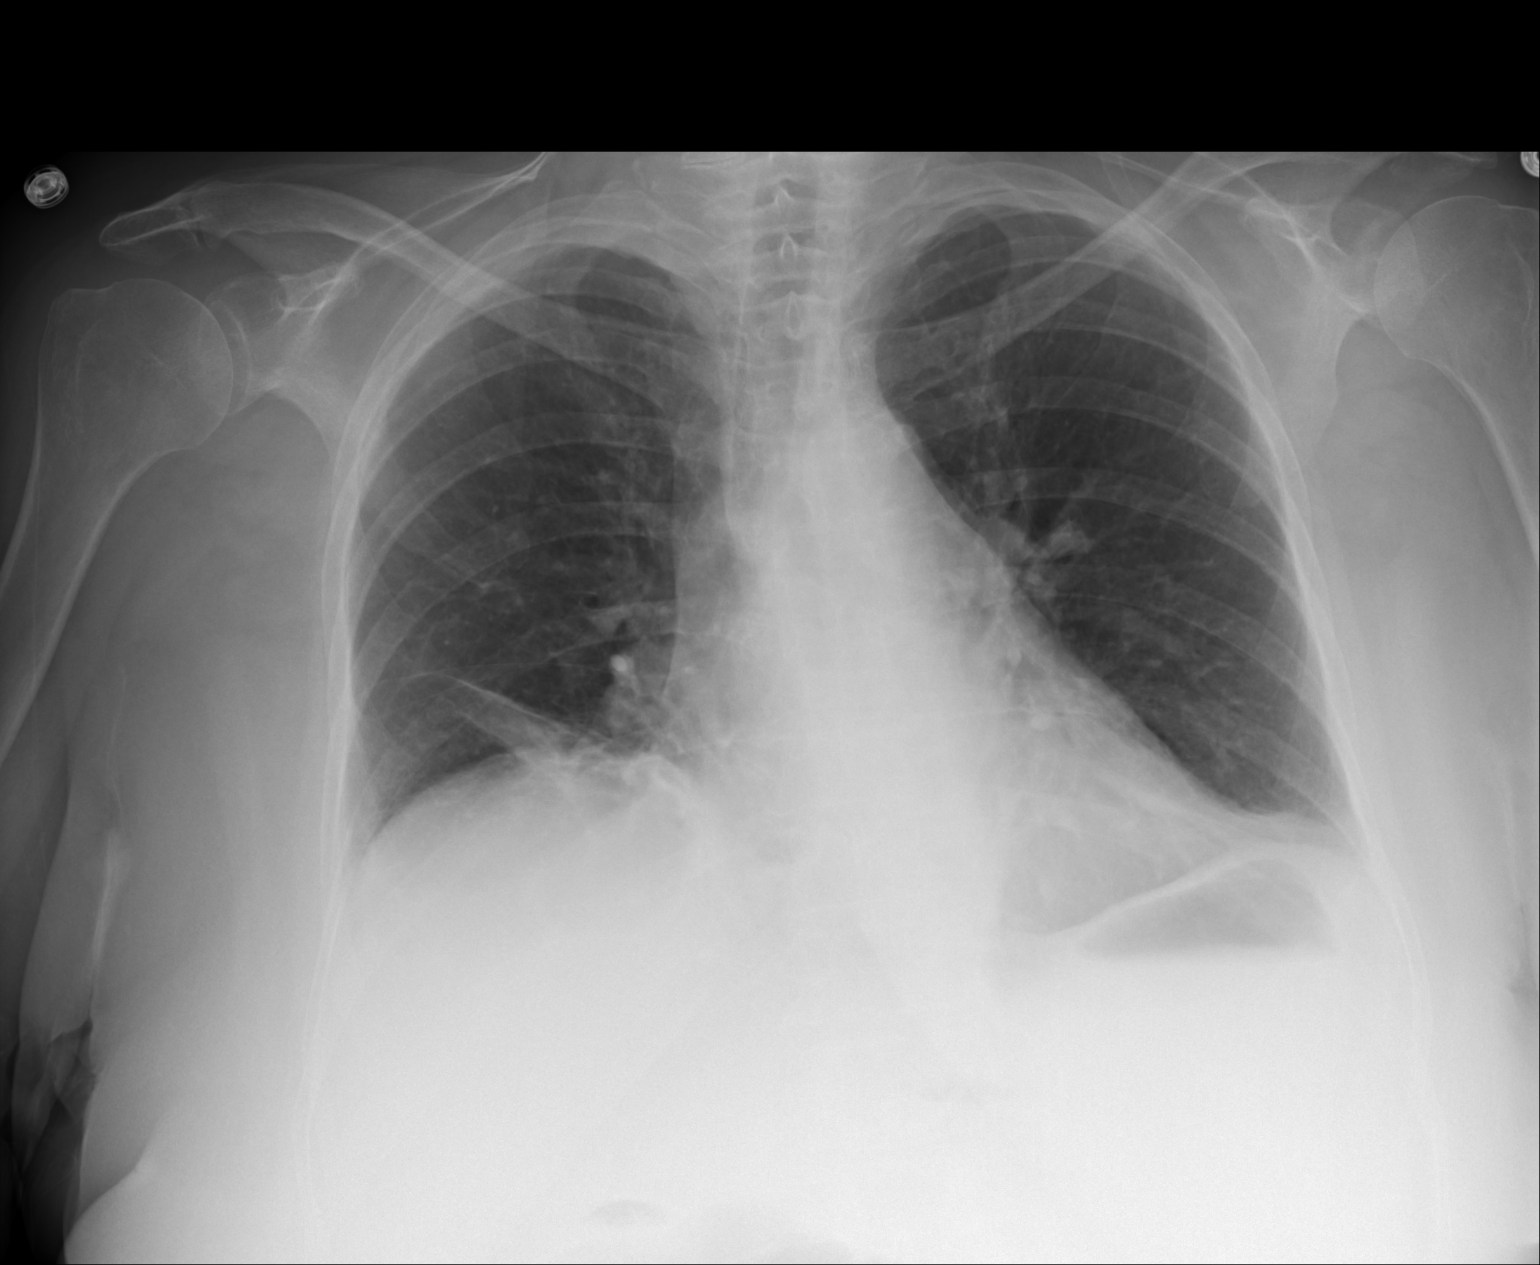

[2 of 2 positions shown; findings below may reference images not displayed]

PROCEDURE:     DXR - DXR CHEST PA (OR AP) AND LATERAL  - June 30, 2013  [DATE]

RESULT:     There is a band of density at the right lung base consistent
with atelectasis. There is poor inspiration. There is no edema, effusion,
mass or pneumothorax. There some minimal atelectasis in the left lung base
as well. Minimal infiltrate is felt to be less likely.
IMPRESSION: Bilateral lung base atelectasis with poor inspiration. No
other acute finding.

[REDACTED]

## 2015-05-13 IMAGING — CR DG ABDOMEN 2V
1 series · 4 of 4 positions shown · non-contrast
Comparison: none

REASON FOR EXAM: f/u ileus.
COMMENTS:

[Series 1: erect ap · 0.17mm/px · 4 of 4 slices shown]
[im 1/4]
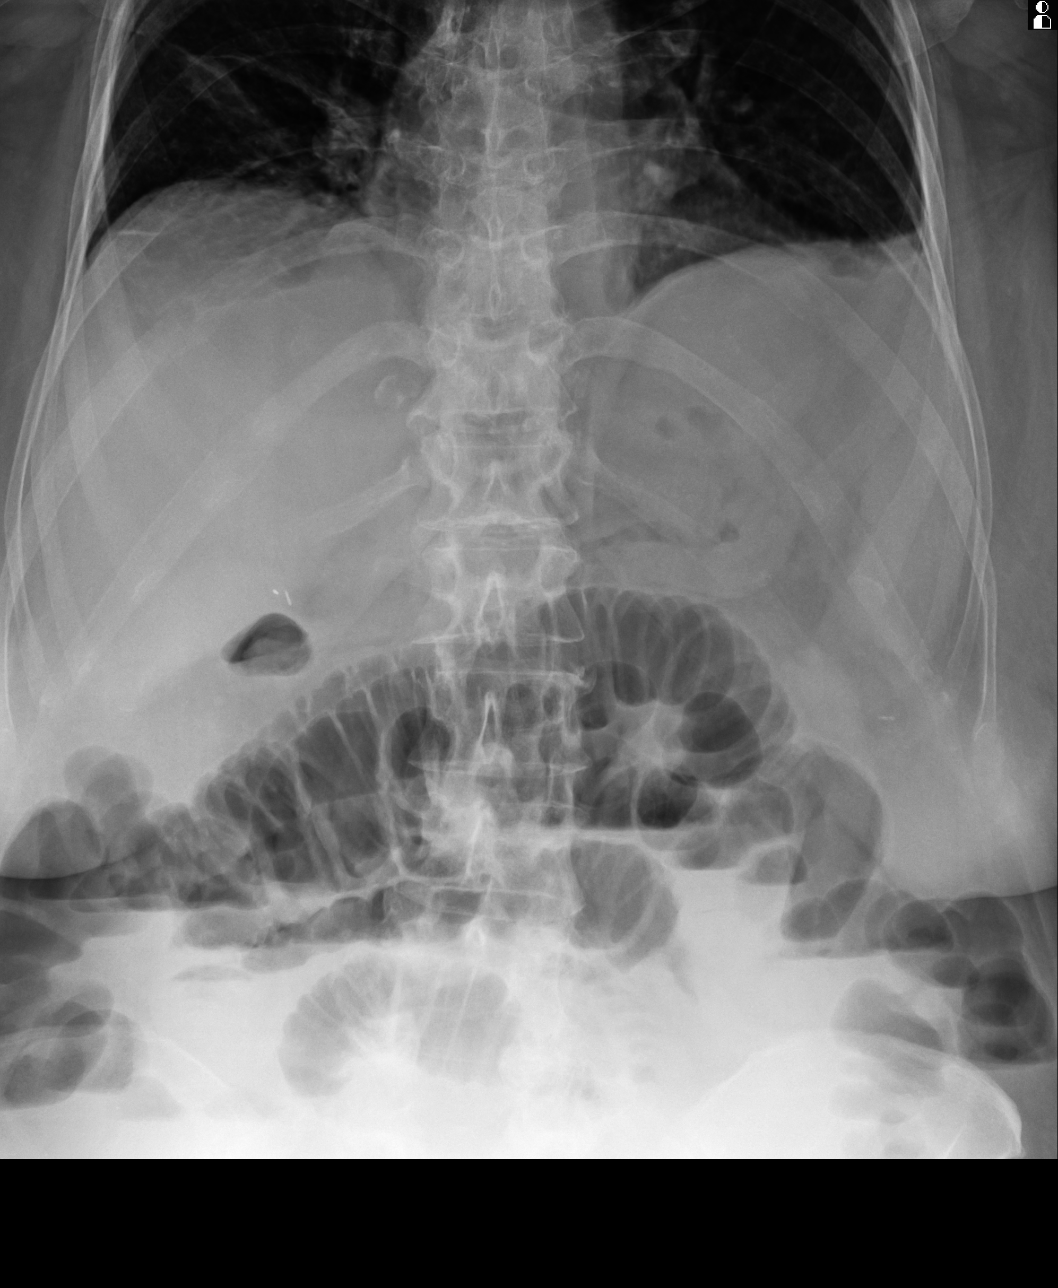
[im 2/4]
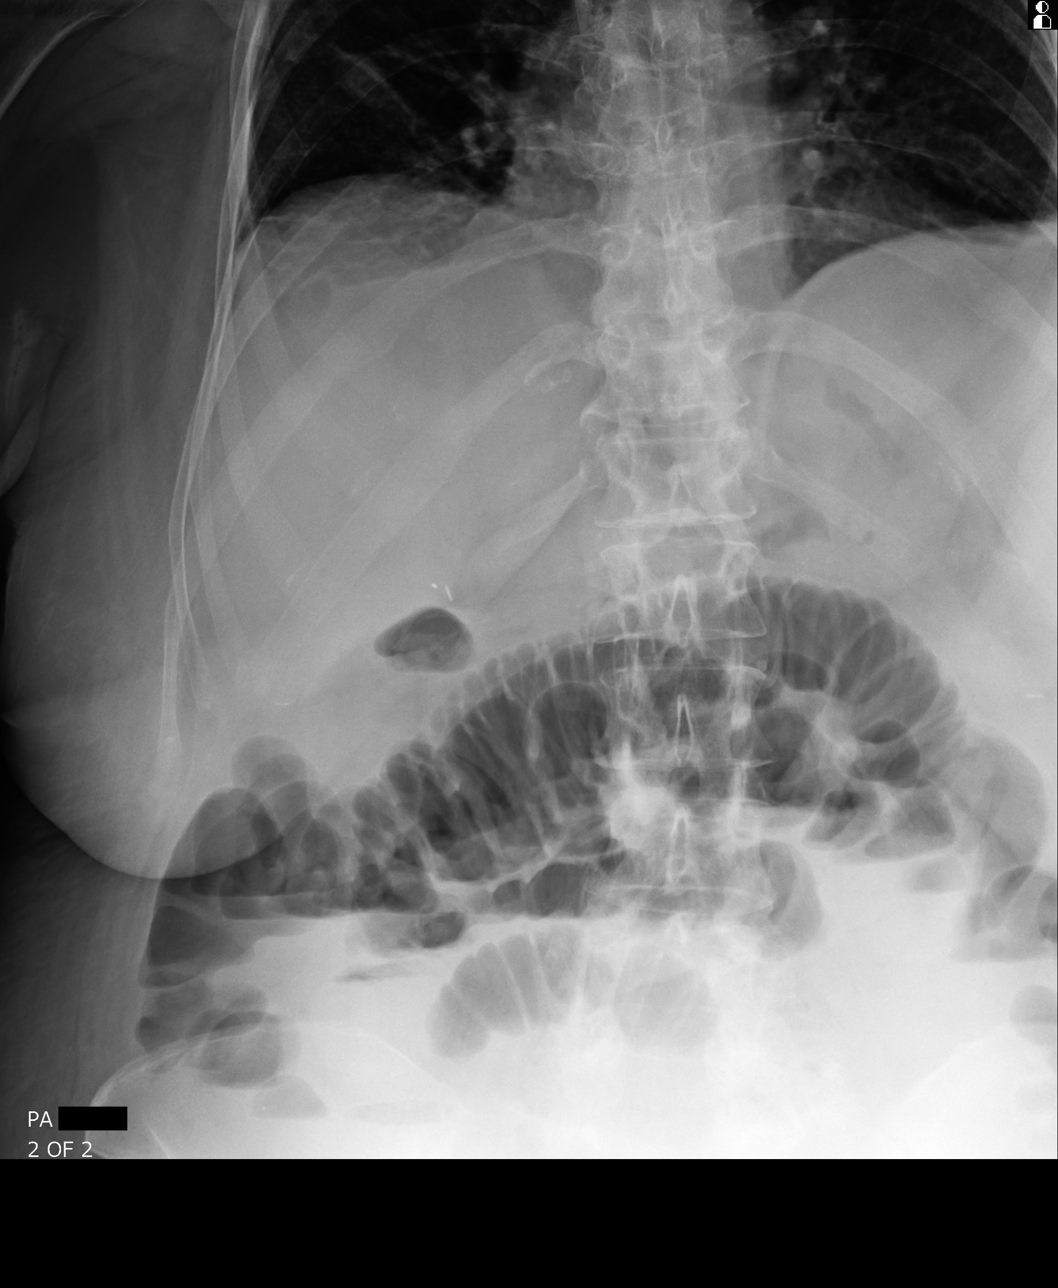
[im 3/4]
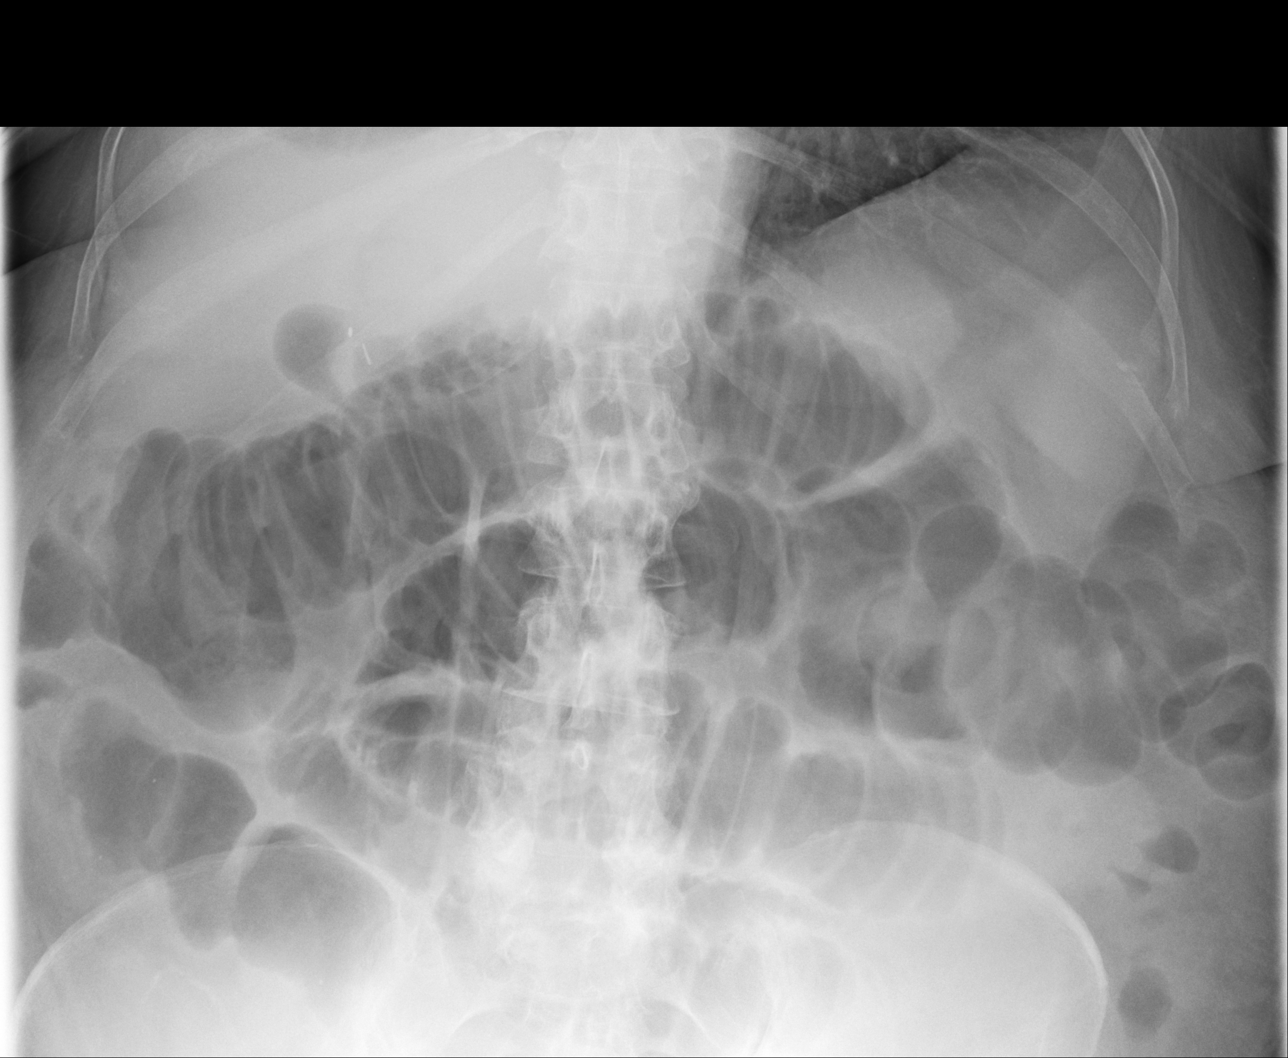
[im 4/4]
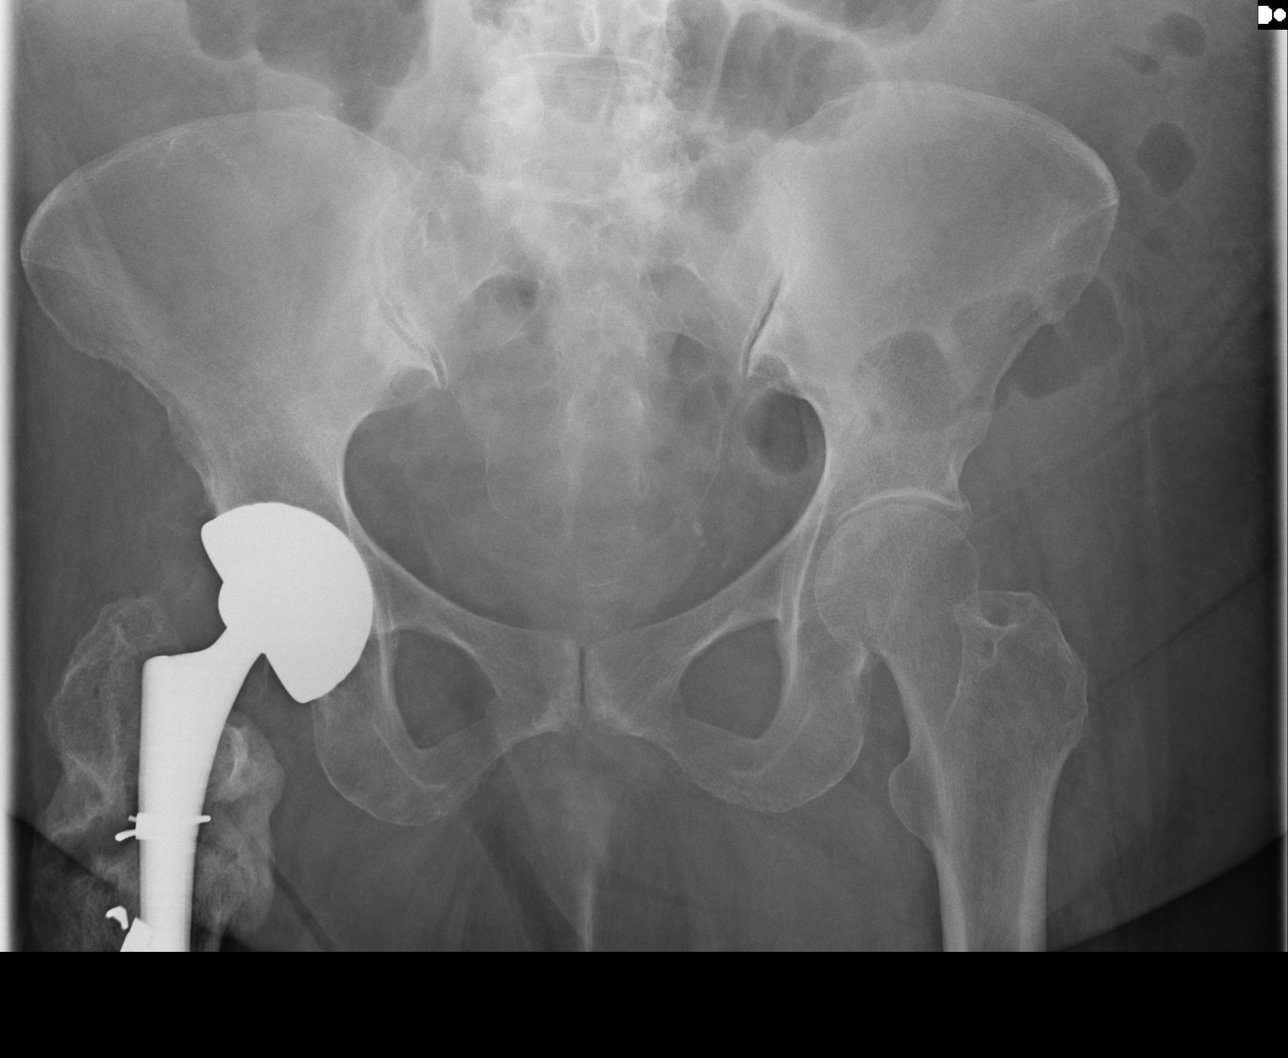

[4 of 4 positions shown; findings below may reference images not displayed]

PROCEDURE:     DXR - DXR ABDOMEN 2 V FLAT AND ERECT  - July 02, 2013  [DATE]

RESULT:     Comparison is made to the study of 06/30/2013.

Air-fluid levels with mildly distended loops of small bowel are seen in the
mid abdominal region. Cholecystectomy clips are present. There does not
appear to be a significant amount of air within the stomach. No free air is
evident. There is some air in the colon to the sigmoid region. Findings may
represent ileus. An evolving obstruction is not excluded but is felt to be
less likely. Right hip arthroplasty hardware is present. Degenerative
changes are seen in the spine. The lung bases appear clear.
IMPRESSION: Continues changes suggestive of an ileus. Close clinical
followup is recommended.

[REDACTED]

## 2015-08-19 ENCOUNTER — Other Ambulatory Visit: Payer: Self-pay | Admitting: Orthopedic Surgery

## 2015-08-19 DIAGNOSIS — M4316 Spondylolisthesis, lumbar region: Secondary | ICD-10-CM

## 2015-08-19 DIAGNOSIS — M48062 Spinal stenosis, lumbar region with neurogenic claudication: Secondary | ICD-10-CM

## 2015-08-26 ENCOUNTER — Ambulatory Visit
Admission: RE | Admit: 2015-08-26 | Discharge: 2015-08-26 | Disposition: A | Discharge status: Home / Self Care | Payer: Medicare Other | Source: Ambulatory Visit | Attending: Orthopedic Surgery | Admitting: Orthopedic Surgery

## 2015-08-26 DIAGNOSIS — M79604 Pain in right leg: Secondary | ICD-10-CM | POA: Insufficient documentation

## 2015-08-26 DIAGNOSIS — M4316 Spondylolisthesis, lumbar region: Secondary | ICD-10-CM

## 2015-08-26 DIAGNOSIS — M5441 Lumbago with sciatica, right side: Secondary | ICD-10-CM | POA: Insufficient documentation

## 2015-08-26 DIAGNOSIS — M48062 Spinal stenosis, lumbar region with neurogenic claudication: Secondary | ICD-10-CM

## 2015-08-26 DIAGNOSIS — M5126 Other intervertebral disc displacement, lumbar region: Secondary | ICD-10-CM | POA: Insufficient documentation

## 2015-08-26 DIAGNOSIS — M4606 Spinal enthesopathy, lumbar region: Secondary | ICD-10-CM | POA: Diagnosis not present

## 2015-08-26 DIAGNOSIS — Z9889 Other specified postprocedural states: Secondary | ICD-10-CM | POA: Insufficient documentation

## 2015-08-26 DIAGNOSIS — M4806 Spinal stenosis, lumbar region: Secondary | ICD-10-CM | POA: Insufficient documentation

## 2017-07-16 ENCOUNTER — Encounter: Payer: Self-pay | Admitting: *Deleted

## 2017-07-18 ENCOUNTER — Ambulatory Visit: Payer: Medicare Other | Admitting: Anesthesiology

## 2017-07-18 ENCOUNTER — Ambulatory Visit
Admission: RE | Admit: 2017-07-18 | Discharge: 2017-07-18 | Disposition: A | Discharge status: Home / Self Care | Payer: Medicare Other | Source: Ambulatory Visit | Attending: Ophthalmology | Admitting: Ophthalmology

## 2017-07-18 ENCOUNTER — Encounter
Admission: RE | Disposition: A | Discharge status: Home / Self Care | Payer: Self-pay | Source: Ambulatory Visit | Attending: Ophthalmology

## 2017-07-18 ENCOUNTER — Other Ambulatory Visit: Payer: Self-pay

## 2017-07-18 DIAGNOSIS — E890 Postprocedural hypothyroidism: Secondary | ICD-10-CM | POA: Diagnosis not present

## 2017-07-18 DIAGNOSIS — Z87891 Personal history of nicotine dependence: Secondary | ICD-10-CM | POA: Insufficient documentation

## 2017-07-18 DIAGNOSIS — Z9889 Other specified postprocedural states: Secondary | ICD-10-CM | POA: Diagnosis not present

## 2017-07-18 DIAGNOSIS — E119 Type 2 diabetes mellitus without complications: Secondary | ICD-10-CM | POA: Diagnosis not present

## 2017-07-18 DIAGNOSIS — Z885 Allergy status to narcotic agent status: Secondary | ICD-10-CM | POA: Diagnosis not present

## 2017-07-18 DIAGNOSIS — M1990 Unspecified osteoarthritis, unspecified site: Secondary | ICD-10-CM | POA: Diagnosis not present

## 2017-07-18 DIAGNOSIS — K219 Gastro-esophageal reflux disease without esophagitis: Secondary | ICD-10-CM | POA: Diagnosis not present

## 2017-07-18 DIAGNOSIS — E785 Hyperlipidemia, unspecified: Secondary | ICD-10-CM | POA: Insufficient documentation

## 2017-07-18 DIAGNOSIS — K449 Diaphragmatic hernia without obstruction or gangrene: Secondary | ICD-10-CM | POA: Diagnosis not present

## 2017-07-18 DIAGNOSIS — E7800 Pure hypercholesterolemia, unspecified: Secondary | ICD-10-CM | POA: Insufficient documentation

## 2017-07-18 DIAGNOSIS — Z90710 Acquired absence of both cervix and uterus: Secondary | ICD-10-CM | POA: Diagnosis not present

## 2017-07-18 DIAGNOSIS — Z888 Allergy status to other drugs, medicaments and biological substances status: Secondary | ICD-10-CM | POA: Insufficient documentation

## 2017-07-18 DIAGNOSIS — I10 Essential (primary) hypertension: Secondary | ICD-10-CM | POA: Insufficient documentation

## 2017-07-18 DIAGNOSIS — Z9049 Acquired absence of other specified parts of digestive tract: Secondary | ICD-10-CM | POA: Diagnosis not present

## 2017-07-18 DIAGNOSIS — K589 Irritable bowel syndrome without diarrhea: Secondary | ICD-10-CM | POA: Diagnosis not present

## 2017-07-18 DIAGNOSIS — H2511 Age-related nuclear cataract, right eye: Secondary | ICD-10-CM | POA: Insufficient documentation

## 2017-07-18 DIAGNOSIS — M810 Age-related osteoporosis without current pathological fracture: Secondary | ICD-10-CM | POA: Insufficient documentation

## 2017-07-18 DIAGNOSIS — Z96653 Presence of artificial knee joint, bilateral: Secondary | ICD-10-CM | POA: Insufficient documentation

## 2017-07-18 HISTORY — PX: CATARACT EXTRACTION W/PHACO: SHX586

## 2017-07-18 HISTORY — DX: Hypothyroidism, unspecified: E03.9

## 2017-07-18 HISTORY — DX: Irritable bowel syndrome, unspecified: K58.9

## 2017-07-18 HISTORY — DX: Other complications of anesthesia, initial encounter: T88.59XA

## 2017-07-18 HISTORY — DX: Personal history of other diseases of the digestive system: Z87.19

## 2017-07-18 HISTORY — DX: Depression, unspecified: F32.A

## 2017-07-18 HISTORY — DX: Adverse effect of unspecified anesthetic, initial encounter: T41.45XA

## 2017-07-18 HISTORY — DX: Major depressive disorder, single episode, unspecified: F32.9

## 2017-07-18 HISTORY — DX: Unspecified osteoarthritis, unspecified site: M19.90

## 2017-07-18 HISTORY — DX: Type 2 diabetes mellitus without complications: E11.9

## 2017-07-18 LAB — GLUCOSE, CAPILLARY: GLUCOSE-CAPILLARY: 159 mg/dL — AB (ref 65–99)

## 2017-07-18 SURGERY — PHACOEMULSIFICATION, CATARACT, WITH IOL INSERTION
Anesthesia: Monitor Anesthesia Care | Site: Eye | Laterality: Right | Wound class: Clean

## 2017-07-18 MED ORDER — MIDAZOLAM HCL 2 MG/2ML IJ SOLN
INTRAMUSCULAR | Status: AC
  Filled 2017-07-18: qty 2

## 2017-07-18 MED ORDER — MOXIFLOXACIN HCL 0.5 % OP SOLN
OPHTHALMIC | Status: DC | PRN
  Administered 2017-07-18: 0.2 mL via OPHTHALMIC

## 2017-07-18 MED ORDER — LIDOCAINE HCL (PF) 4 % IJ SOLN
INTRAMUSCULAR | Status: AC
  Filled 2017-07-18: qty 5

## 2017-07-18 MED ORDER — EPINEPHRINE PF 1 MG/ML IJ SOLN
INTRAMUSCULAR | Status: DC | PRN
  Administered 2017-07-18: 1 mL via OPHTHALMIC

## 2017-07-18 MED ORDER — POVIDONE-IODINE 5 % OP SOLN
OPHTHALMIC | Status: DC | PRN
  Administered 2017-07-18: 1 via OPHTHALMIC

## 2017-07-18 MED ORDER — NA CHONDROIT SULF-NA HYALURON 40-17 MG/ML IO SOLN
INTRAOCULAR | Status: DC | PRN
  Administered 2017-07-18: 1 mL via INTRAOCULAR

## 2017-07-18 MED ORDER — ARMC OPHTHALMIC DILATING DROPS
1.0000 "application " | OPHTHALMIC | Status: AC
  Administered 2017-07-18 (×3): 1 via OPHTHALMIC

## 2017-07-18 MED ORDER — LIDOCAINE HCL (PF) 4 % IJ SOLN
INTRAOCULAR | Status: DC | PRN
  Administered 2017-07-18: 2 mL via OPHTHALMIC

## 2017-07-18 MED ORDER — POVIDONE-IODINE 5 % OP SOLN
OPHTHALMIC | Status: AC
  Filled 2017-07-18: qty 30

## 2017-07-18 MED ORDER — ARMC OPHTHALMIC DILATING DROPS
OPHTHALMIC | Status: AC
  Administered 2017-07-18: 1 via OPHTHALMIC
  Filled 2017-07-18: qty 0.4

## 2017-07-18 MED ORDER — MIDAZOLAM HCL 2 MG/2ML IJ SOLN
INTRAMUSCULAR | Status: DC | PRN
  Administered 2017-07-18 (×2): 1 mg via INTRAVENOUS

## 2017-07-18 MED ORDER — NA CHONDROIT SULF-NA HYALURON 40-17 MG/ML IO SOLN
INTRAOCULAR | Status: AC
  Filled 2017-07-18: qty 1

## 2017-07-18 MED ORDER — EPINEPHRINE PF 1 MG/ML IJ SOLN
INTRAMUSCULAR | Status: AC
  Filled 2017-07-18: qty 1

## 2017-07-18 MED ORDER — CARBACHOL 0.01 % IO SOLN
INTRAOCULAR | Status: DC | PRN
  Administered 2017-07-18: 0.5 mL via INTRAOCULAR

## 2017-07-18 MED ORDER — MOXIFLOXACIN HCL 0.5 % OP SOLN
OPHTHALMIC | Status: AC
  Filled 2017-07-18: qty 3

## 2017-07-18 MED ORDER — ONDANSETRON HCL 4 MG/2ML IJ SOLN
INTRAMUSCULAR | Status: AC
  Filled 2017-07-18: qty 2

## 2017-07-18 MED ORDER — ONDANSETRON HCL 4 MG/2ML IJ SOLN
INTRAMUSCULAR | Status: DC | PRN
  Administered 2017-07-18: 4 mg via INTRAVENOUS

## 2017-07-18 MED ORDER — SODIUM CHLORIDE 0.9 % IV SOLN
INTRAVENOUS | Status: DC
  Administered 2017-07-18 (×2): via INTRAVENOUS

## 2017-07-18 MED ORDER — MOXIFLOXACIN HCL 0.5 % OP SOLN: 1.0000 [drp] | OPHTHALMIC | Status: DC | PRN

## 2017-07-18 SURGICAL SUPPLY — 16 items
GLOVE BIO SURGEON STRL SZ8 (GLOVE) ×3 IMPLANT
GLOVE BIOGEL M 6.5 STRL (GLOVE) ×3 IMPLANT
GLOVE SURG LX 8.0 MICRO (GLOVE) ×2
GLOVE SURG LX STRL 8.0 MICRO (GLOVE) ×1 IMPLANT
GOWN STRL REUS W/ TWL LRG LVL3 (GOWN DISPOSABLE) ×2 IMPLANT
GOWN STRL REUS W/TWL LRG LVL3 (GOWN DISPOSABLE) ×6
LABEL CATARACT MEDS ST (LABEL) ×3 IMPLANT
LENS IOL ACRYSOF IQ 21.0 (Intraocular Lens) ×2 IMPLANT
PACK CATARACT (MISCELLANEOUS) ×3 IMPLANT
PACK CATARACT BRASINGTON LX (MISCELLANEOUS) ×3 IMPLANT
PACK EYE AFTER SURG (MISCELLANEOUS) ×3 IMPLANT
SOL BSS BAG (MISCELLANEOUS) ×3
SOLUTION BSS BAG (MISCELLANEOUS) ×1 IMPLANT
SYR 5ML LL (SYRINGE) ×3 IMPLANT
WATER STERILE IRR 250ML POUR (IV SOLUTION) ×3 IMPLANT
WIPE NON LINTING 3.25X3.25 (MISCELLANEOUS) ×3 IMPLANT

## 2017-07-18 NOTE — Discharge Instructions (Signed)
Eye Surgery Discharge Instructions  Expect mild scratchy sensation or mild soreness. DO NOT RUB YOUR EYE!  The day of surgery:  Minimal physical activity, but bed rest is not required  No reading, computer work, or close hand work  No bending, lifting, or straining.  May watch TV  For 24 hours:  No driving, legal decisions, or alcoholic beverages  Safety precautions  Eat anything you prefer: It is better to start with liquids, then soup then solid foods.  _____ Eye patch should be worn until postoperative exam tomorrow.  ____ Solar shield eyeglasses should be worn for comfort in the sunlight/patch while sleeping  Resume all regular medications including aspirin or Coumadin if these were discontinued prior to surgery. You may shower, bathe, shave, or wash your hair. Tylenol may be taken for mild discomfort.  Call your doctor if you experience significant pain, nausea, or vomiting, fever > 101 or other signs of infection. [phone] or [phone] Specific instructions:  Follow-up Information    Birder Robson, MD Follow up.   Specialty:  Ophthalmology Why:  07-19-17 at 11:00 Contact information: [address] [place] [address] [phone]          Eye Surgery Discharge Instructions  Expect mild scratchy sensation or mild soreness. DO NOT RUB YOUR EYE!  The day of surgery:  Minimal physical activity, but bed rest is not required  No reading, computer work, or close hand work  No bending, lifting, or straining.  May watch TV  For 24 hours:  No driving, legal decisions, or alcoholic beverages  Safety precautions  Eat anything you prefer: It is better to start with liquids, then soup then solid foods.  _____ Eye patch should be worn until postoperative exam tomorrow.  ____ Solar shield eyeglasses should be worn for comfort in the sunlight/patch while sleeping  Resume all regular medications including aspirin or Coumadin if these  were discontinued prior to surgery. You may shower, bathe, shave, or wash your hair. Tylenol may be taken for mild discomfort.  Call your doctor if you experience significant pain, nausea, or vomiting, fever > 101 or other signs of infection. [phone] or [phone] Specific instructions:  Follow-up Information    Birder Robson, MD Follow up.   Specialty:  Ophthalmology Why:  07-19-17 at 11:00 Contact information: [place] [address] [phone]

## 2017-07-18 NOTE — Anesthesia Preprocedure Evaluation (Signed)
Anesthesia Evaluation  Patient identified by MRN, date of birth, ID band Patient awake    Reviewed: Allergy & Precautions, H&P , NPO status , Patient's Chart, lab work & pertinent test results  History of Anesthesia Complications (+) PONV and history of anesthetic complications  Airway Mallampati: III  TM Distance: >3 FB Neck ROM: full    Dental  (+) Chipped, Poor Dentition   Pulmonary neg shortness of breath, former smoker,           Cardiovascular Exercise Tolerance: Good hypertension, (-) Past MI      Neuro/Psych PSYCHIATRIC DISORDERS Depression negative neurological ROS     GI/Hepatic Neg liver ROS, hiatal hernia, GERD  ,  Endo/Other  diabetes, Type 2Hypothyroidism Hyperthyroidism   Renal/GU      Musculoskeletal  (+) Arthritis ,   Abdominal   Peds  Hematology negative hematology ROS (+)   Anesthesia Other Findings Past Medical History: No date: Arthritis No date: Complication of anesthesia     Comment:  BP DOWN WITH PREVIOUS ANESTHESIA No date: Depression No date: Diabetes mellitus without complication (HCC) No date: GERD (gastroesophageal reflux disease) No date: History of hiatal hernia No date: Hyperlipidemia No date: Hypertension No date: Hypothyroidism No date: IBS (irritable bowel syndrome) No date: Thyroid disease  Past Surgical History: 1972: ABDOMINAL HYSTERECTOMY     Comment:  partial 06/28/13: APPENDECTOMY No date: CHOLECYSTECTOMY 2012: COLONOSCOPY No date: EYE SURGERY 2002,2006: JOINT REPLACEMENT; Bilateral 2011: JOINT REPLACEMENT; Right     Comment:  hip No date: SPINE SURGERY     Comment:  lower back 2012: UPPER GI ENDOSCOPY  BMI    Body Mass Index:  37.20 kg/m      Reproductive/Obstetrics negative OB ROS                             Anesthesia Physical Anesthesia Plan  ASA: III  Anesthesia Plan: MAC   Post-op Pain Management:    Induction:  Intravenous  PONV Risk Score and Plan:   Airway Management Planned: Natural Airway and Nasal Cannula  Additional Equipment:   Intra-op Plan:   Post-operative Plan:   Informed Consent: I have reviewed the patients History and Physical, chart, labs and discussed the procedure including the risks, benefits and alternatives for the proposed anesthesia with the patient or authorized representative who has indicated his/her understanding and acceptance.   Dental Advisory Given  Plan Discussed with: Anesthesiologist, CRNA and Surgeon  Anesthesia Plan Comments: (Patient consented for risks of anesthesia including but not limited to:  - adverse reactions to medications - damage to teeth, lips or other oral mucosa - sore throat or hoarseness - Damage to heart, brain, lungs or loss of life  Patient voiced understanding.)        Anesthesia Quick Evaluation

## 2017-07-18 NOTE — H&P (Signed)
All labs reviewed. Abnormal studies sent to patients PCP when indicated.  Previous H&P reviewed, patient examined, there are NO CHANGES.  Brandy Shore LOUIS11/14/20187:12 AM

## 2017-07-18 NOTE — Op Note (Signed)
PREOPERATIVE DIAGNOSIS:  Nuclear sclerotic cataract of the right eye.   POSTOPERATIVE DIAGNOSIS:  nuclear sclerotic cataract right eye   OPERATIVE PROCEDURE: Procedure(s): CATARACT EXTRACTION PHACO AND INTRAOCULAR LENS PLACEMENT (IOC)   SURGEON:  Birder Robson, MD.   ANESTHESIA:  Anesthesiologist: Piscitello, Precious Haws, MD CRNA: Nelda Marseille, CRNA  1.      Managed anesthesia care. 2.      0.62ml of Shugarcaine was instilled in the eye following the paracentesis.   COMPLICATIONS:  None.   TECHNIQUE:   Stop and chop   DESCRIPTION OF PROCEDURE:  The patient was examined and consented in the preoperative holding area where the aforementioned topical anesthesia was applied to the right eye and then brought back to the Operating Room where the right eye was prepped and draped in the usual sterile ophthalmic fashion and a lid speculum was placed. A paracentesis was created with the side port blade and the anterior chamber was filled with viscoelastic. A near clear corneal incision was performed with the steel keratome. A continuous curvilinear capsulorrhexis was performed with a cystotome followed by the capsulorrhexis forceps. Hydrodissection and hydrodelineation were carried out with BSS on a blunt cannula. The lens was removed in a stop and chop  technique and the remaining cortical material was removed with the irrigation-aspiration handpiece. The capsular bag was inflated with viscoelastic and the Technis [id number]  lens was placed in the capsular bag without complication. The remaining viscoelastic was removed from the eye with the irrigation-aspiration handpiece. The wounds were hydrated. The anterior chamber was flushed with Miostat and the eye was inflated to physiologic pressure. 0.20ml of Vigamox was placed in the anterior chamber. The wounds were found to be water tight. The eye was dressed with Vigamox. The patient was given protective glasses to wear throughout the day and a shield with which  to sleep tonight. The patient was also given drops with which to begin a drop regimen today and will follow-up with me in one day. Implant Name Type Inv. Item Serial No. Manufacturer Lot No. LRB No. Used  LENS IOL ACRYSOF IQ 21.0 - [id number] Intraocular Lens LENS IOL ACRYSOF IQ 21.0 [id number] ALCON  Right 1   Procedure(s) with comments: CATARACT EXTRACTION PHACO AND INTRAOCULAR LENS PLACEMENT (IOC) (Right) - [place] 00:33 AP% 20.7 CDE 6.99 Fluid pack lot # [id number]  Electronically signed: Hanson 07/18/2017 7:51 AM

## 2017-07-18 NOTE — Anesthesia Procedure Notes (Signed)
Procedure Name: MAC Date/Time: 07/18/2017 7:32 AM Performed by: Nelda Marseille, CRNA Pre-anesthesia Checklist: Patient identified, Emergency Drugs available, Suction available, Patient being monitored and Timeout performed

## 2017-07-18 NOTE — Anesthesia Postprocedure Evaluation (Signed)
Anesthesia Post Note  Patient: Brandy George  Procedure(s) Performed: CATARACT EXTRACTION PHACO AND INTRAOCULAR LENS PLACEMENT (IOC) (Right Eye)  Patient location during evaluation: PACU Anesthesia Type: MAC Level of consciousness: awake, awake and alert, oriented and patient cooperative Pain management: pain level controlled Vital Signs Assessment: post-procedure vital signs reviewed and stable Respiratory status: spontaneous breathing, nonlabored ventilation and respiratory function stable Cardiovascular status: stable Postop Assessment: no apparent nausea or vomiting Anesthetic complications: no     Last Vitals:  Vitals:   07/18/17 0603  Pulse: (!) 58  Resp: 16  Temp: 36.9 C  SpO2: 98%    Last Pain:  Vitals:   07/18/17 0603  TempSrc: Oral  PainSc: 1                  Trasean Delima,  Tramaine Snell R

## 2017-07-18 NOTE — Addendum Note (Signed)
Addendum  created 07/18/17 0800 by Nelda Marseille, CRNA   Charge Capture section accepted

## 2017-07-18 NOTE — Transfer of Care (Signed)
Immediate Anesthesia Transfer of Care Note  Patient: Brandy George  Procedure(s) Performed: CATARACT EXTRACTION PHACO AND INTRAOCULAR LENS PLACEMENT (IOC) (Right Eye)  Patient Location: PACU  Anesthesia Type:MAC  Level of Consciousness: awake, alert  and oriented  Airway & Oxygen Therapy: Patient Spontanous Breathing  Post-op Assessment: Report given to RN and Post -op Vital signs reviewed and stable  Post vital signs: Reviewed and stable  Last Vitals:  Vitals:   07/18/17 0603  Pulse: (!) 58  Resp: 16  Temp: 36.9 C  SpO2: 98%    Last Pain:  Vitals:   07/18/17 0603  TempSrc: Oral  PainSc: 1          Complications: No apparent anesthesia complications

## 2017-07-18 NOTE — Anesthesia Post-op Follow-up Note (Signed)
Anesthesia QCDR form completed.        

## 2018-08-12 ENCOUNTER — Other Ambulatory Visit: Payer: Self-pay | Admitting: Nephrology

## 2018-08-12 DIAGNOSIS — N184 Chronic kidney disease, stage 4 (severe): Secondary | ICD-10-CM

## 2018-08-19 ENCOUNTER — Ambulatory Visit
Admission: RE | Admit: 2018-08-19 | Discharge: 2018-08-19 | Disposition: A | Discharge status: Home / Self Care | Payer: Medicare Other | Source: Ambulatory Visit | Attending: Nephrology | Admitting: Nephrology

## 2018-08-19 DIAGNOSIS — N184 Chronic kidney disease, stage 4 (severe): Secondary | ICD-10-CM | POA: Diagnosis not present

## 2018-09-05 ENCOUNTER — Ambulatory Visit
Admission: RE | Admit: 2018-09-05 | Discharge: 2018-09-05 | Disposition: A | Discharge status: Home / Self Care | Payer: Medicare Other | Attending: Nephrology | Admitting: Nephrology

## 2018-09-05 ENCOUNTER — Ambulatory Visit
Admission: RE | Admit: 2018-09-05 | Discharge: 2018-09-05 | Disposition: A | Discharge status: Home / Self Care | Payer: Medicare Other | Source: Ambulatory Visit | Attending: Nephrology | Admitting: Nephrology

## 2018-09-05 ENCOUNTER — Other Ambulatory Visit: Payer: Self-pay | Admitting: Nephrology

## 2018-09-05 DIAGNOSIS — N200 Calculus of kidney: Secondary | ICD-10-CM | POA: Insufficient documentation

## 2019-08-31 ENCOUNTER — Emergency Department
Admission: EM | Admit: 2019-08-31 | Discharge: 2019-08-31 | Disposition: A | Discharge status: Home / Self Care | Payer: Medicare Other | Attending: Emergency Medicine | Admitting: Emergency Medicine

## 2019-08-31 ENCOUNTER — Encounter: Payer: Self-pay | Admitting: Emergency Medicine

## 2019-08-31 ENCOUNTER — Emergency Department: Payer: Medicare Other

## 2019-08-31 ENCOUNTER — Other Ambulatory Visit: Payer: Self-pay

## 2019-08-31 DIAGNOSIS — W010XXA Fall on same level from slipping, tripping and stumbling without subsequent striking against object, initial encounter: Secondary | ICD-10-CM | POA: Insufficient documentation

## 2019-08-31 DIAGNOSIS — S42211A Unspecified displaced fracture of surgical neck of right humerus, initial encounter for closed fracture: Secondary | ICD-10-CM | POA: Insufficient documentation

## 2019-08-31 DIAGNOSIS — Z96641 Presence of right artificial hip joint: Secondary | ICD-10-CM | POA: Insufficient documentation

## 2019-08-31 DIAGNOSIS — Z79899 Other long term (current) drug therapy: Secondary | ICD-10-CM | POA: Diagnosis not present

## 2019-08-31 DIAGNOSIS — Y9301 Activity, walking, marching and hiking: Secondary | ICD-10-CM | POA: Insufficient documentation

## 2019-08-31 DIAGNOSIS — Y92018 Other place in single-family (private) house as the place of occurrence of the external cause: Secondary | ICD-10-CM | POA: Insufficient documentation

## 2019-08-31 DIAGNOSIS — R202 Paresthesia of skin: Secondary | ICD-10-CM | POA: Insufficient documentation

## 2019-08-31 DIAGNOSIS — E039 Hypothyroidism, unspecified: Secondary | ICD-10-CM | POA: Diagnosis not present

## 2019-08-31 DIAGNOSIS — Y998 Other external cause status: Secondary | ICD-10-CM | POA: Insufficient documentation

## 2019-08-31 DIAGNOSIS — Z87891 Personal history of nicotine dependence: Secondary | ICD-10-CM | POA: Insufficient documentation

## 2019-08-31 DIAGNOSIS — E119 Type 2 diabetes mellitus without complications: Secondary | ICD-10-CM | POA: Insufficient documentation

## 2019-08-31 DIAGNOSIS — I10 Essential (primary) hypertension: Secondary | ICD-10-CM | POA: Insufficient documentation

## 2019-08-31 DIAGNOSIS — S4991XA Unspecified injury of right shoulder and upper arm, initial encounter: Secondary | ICD-10-CM | POA: Diagnosis present

## 2019-08-31 DIAGNOSIS — Z7982 Long term (current) use of aspirin: Secondary | ICD-10-CM | POA: Insufficient documentation

## 2019-08-31 MED ORDER — OXYCODONE-ACETAMINOPHEN 5-325 MG PO TABS
1.0000 | ORAL_TABLET | Freq: Once | ORAL | Status: AC
  Administered 2019-08-31: 1 via ORAL
  Filled 2019-08-31: qty 1

## 2019-08-31 MED ORDER — OXYCODONE-ACETAMINOPHEN 5-325 MG PO TABS: 1.0000 | ORAL_TABLET | Freq: Four times a day (QID) | ORAL | 0 refills | Status: AC | PRN

## 2019-08-31 NOTE — Discharge Instructions (Addendum)
Please wear the shoulder immobilizer.  Please call Dr. Marry Guan tomorrow morning for an appointment this week.  Home health should be contacting you.  You can take Percocet for pain.

## 2019-08-31 NOTE — ED Notes (Signed)
Pt presents to the ED after a mechanical fall last night. Pt c/o R shoulder pain. Pt says it is painful to move her arm at the shoulder. Pt is able to extend elbow and wiggle finger. Pt peripheral vascular assessment is WNL. Pt has no other complaints at this time.

## 2019-08-31 NOTE — TOC Initial Note (Signed)
Transition of Care (TOC) - Initial/Assessment Note    Patient Details  Name: Brandy George MRN: [id number] Date of Birth: 12/26/1945  Transition of Care (TOC) CM/SW Contact:    Maritza I Parchment, LCSW Phone Number: 08/31/2019, 1:53 PM  Clinical Narrative:  TOC CM/SW met with the patient at ED bedside. Discussed with the patient attending provider's recommendations for home assistance and durable medical equipment. Patient agreed to recommendations orders.  Patient reported that she lives alone. Has a daughter that lives in [place].  Patient plans on staying with her daughter this week but worries about being in her home thereafter.  Patient prefers [facility] for home health and [facility] for DME    Expected Discharge Plan: Home w Home Health Services Barriers to Discharge: No Barriers Identified  Patient Goals and CMS Choice Patient states their goals for this hospitalization and ongoing recovery are:: Go home with home health sercies. CMS Medicare.gov Compare Post Acute Care list provided to:: Patient Choice offered to / list presented to : Patient  Expected Discharge Plan and Services Expected Discharge Plan: Home w Home Health Services In-house Referral: Clinical Social Work Discharge Planning Services: NA Post Acute Care Choice: Home Health Living arrangements for the past 2 months: Single Family Home(Patient lives alone)                 DME Arranged: 3-N-1 DME Agency: [facility] Date DME Agency Contacted: 08/31/19 Time DME Agency Contacted: 0103 Representative spoke with at DME Agency: Brad [phone] HH Arranged: PT, OT, Nurse's Aide HH Agency: [facility] Date HH Agency Contacted: 08/31/19 Time HH Agency Contacted: 1343 Representative spoke with at HH Agency: Cory [phone]  Prior Living Arrangements/Services Living arrangements for the past 2 months: Single Family Home(Patient lives alone) Lives with:: Self Patient language and need for  interpreter reviewed:: No Do you feel safe going back to the place where you live?: Yes      Need for Family Participation in Patient Care: No (Comment) Care giver support system in place?: Yes (comment)   Criminal Activity/Legal Involvement Pertinent to Current Situation/Hospitalization: No - Comment as needed  Activities of Daily Living    Permission Sought/Granted Permission sought to share information with : Facility Contact Representative, Family Supports Permission granted to share information with : Yes, Verbal Permission Granted  Share Information with NAME: vurne,amy Daughter   [phone]  Permission granted to share info w AGENCY: Home Health    Emotional Assessment Appearance:: Appears older than stated age Attitude/Demeanor/Rapport: Gracious Affect (typically observed): Calm Orientation: : Oriented to Self, Oriented to Situation, Fluctuating Orientation (Suspected and/or reported Sundowners), Oriented to Place Alcohol / Substance Use: Not Applicable Psych Involvement: No (comment)  Admission diagnosis:  fell last nighht r shoulder pain Patient Active Problem List   Diagnosis Date Noted  . Postoperative intra-abdominal abscess 07/18/2013  . Diarrhea 07/15/2013   PCP:  Morayati, Shamil J, MD Pharmacy:    Social Determinants of Health (SDOH) Interventions   Readmission Risk Interventions No flowsheet data found.  

## 2019-08-31 NOTE — ED Provider Notes (Signed)
[facility] Emergency Department Provider Note  ____________________________________________  Time seen: Approximately 8:50 AM  I have reviewed the triage vital signs and the nursing notes.   HISTORY  Chief Complaint Fall    HPI Brandy George is a 73 y.o. female that presents to the emergency department for evaluation of right shoulder pain after a fall this morning.  Patient states that her foot got tangled up in a chair and she fell to the ground.  She has had some tingling to her right thumb but is able to feel her thumb and move her thumb normally.  She did not hit her head or lose consciousness.  Patient lives by herself and is independent.  She does not use a walker or cane.  She is right-hand dominant.  She was planning to go to her daughter's to spend a week with her daughter this week.  Patient takes Brandy aspirin daily.  Patient denies headache, dizziness, neck pain.   Past Medical History:  Diagnosis Date  . Arthritis   . Complication of anesthesia    BP DOWN WITH PREVIOUS ANESTHESIA  . Depression   . Diabetes mellitus without complication ([place])   . GERD (gastroesophageal reflux disease)   . History of hiatal hernia   . Hyperlipidemia   . Hypertension   . Hypothyroidism   . IBS (irritable bowel syndrome)   . Thyroid disease     Patient Active Problem List   Diagnosis Date Noted  . Postoperative intra-abdominal abscess 07/18/2013  . Diarrhea 07/15/2013    Past Surgical History:  Procedure Laterality Date  . ABDOMINAL HYSTERECTOMY  1972   partial  . APPENDECTOMY  06/28/13  . CATARACT EXTRACTION W/PHACO Right 07/18/2017   Procedure: CATARACT EXTRACTION PHACO AND INTRAOCULAR LENS PLACEMENT (IOC);  Surgeon: Birder Robson, MD;  Location: [facility] ORS;  Service: Ophthalmology;  Laterality: Right;  [place] 00:33 AP% 20.7 CDE 6.99 Fluid pack lot # [id number]  . CHOLECYSTECTOMY    . COLONOSCOPY  2012  . EYE SURGERY    . JOINT REPLACEMENT  Bilateral 2002,2006  . JOINT REPLACEMENT Right 2011   hip  . SPINE SURGERY     lower back  . UPPER GI ENDOSCOPY  2012    Prior to Admission medications   Medication Sig Start Date End Date Taking? Authorizing Provider  allopurinol (ZYLOPRIM) 100 MG tablet Take 100 mg 2 (two) times daily by mouth.  05/29/13   [provider]  ALPRAZolam Duanne Moron) 1 MG tablet Take 1 mg daily by mouth.  05/23/13   [provider]  aspirin 325 MG tablet Take 325 mg daily by mouth.    [provider]  B Complex-C (SUPER B COMPLEX PO) Take 1 tablet daily by mouth.    [provider]  Biotin 10 MG CAPS Take 10 mg daily by mouth.    [provider]  Calcium Carbonate (CALCIUM 600 PO) Take 600 mg daily by mouth.    [provider]  Cholecalciferol (VITAMIN D3) 1000 units CAPS Take 1,000 Units daily by mouth.    [provider]  FLUoxetine (PROZAC) 40 MG capsule Take 40 mg daily by mouth.    [provider]  gemfibrozil (LOPID) 600 MG tablet Take 600 mg daily by mouth.  05/29/13   [provider]  metoprolol tartrate (LOPRESSOR) 25 MG tablet Take 25 mg 2 (two) times daily by mouth.    [provider]  Multiple Vitamin (MULTIVITAMIN) tablet Take 1 tablet by mouth  daily.    [provider]  nepafenac (ILEVRO) 0.3 % ophthalmic suspension Place 1 drop See admin instructions into the right eye. Begin 2 days prior to surgery, place 1 drop in right eye daily. 07/16/17   [provider]  omeprazole (PRILOSEC) 40 MG capsule Take 40 mg 2 (two) times daily by mouth.  05/29/13   [provider]  oxyCODONE-acetaminophen (PERCOCET) 5-325 MG tablet Take 1 tablet by mouth every 6 (six) hours as needed for up to 3 days. 08/31/19 09/03/19  Laban Emperor, PA-C  simvastatin (ZOCOR) 20 MG tablet Take 20 mg daily by mouth.  04/29/13   [provider]  thyroid (ARMOUR THYROID) 120 MG tablet Take 120 mg daily by mouth.      [provider]  triamterene-hydrochlorothiazide (MAXZIDE-25) 37.5-25 MG tablet Take 1 tablet daily by mouth.    [provider]    Allergies Lisinopril, Morphine, and Tape  No family history on file.  Social History Social History   Tobacco Use  . Smoking status: Former Smoker    Years: 20.00  . Smokeless tobacco: Never Used  Substance Use Topics  . Alcohol use: Yes  . Drug use: No     Review of Systems  Gastrointestinal: No nausea, no vomiting.  Musculoskeletal: Positive for shoulder pain. Skin: Negative for rash, abrasions, lacerations, ecchymosis. Neurological: Negative for headaches   ____________________________________________   PHYSICAL EXAM:  VITAL SIGNS: ED Triage Vitals  Enc Vitals Group     BP 08/31/19 0803 115/64     Pulse Rate 08/31/19 0803 74     Resp 08/31/19 0803 16     Temp 08/31/19 0803 98.7 F (37.1 C)     Temp Source 08/31/19 0803 Oral     SpO2 08/31/19 0803 99 %     Weight 08/31/19 0805 210 lb 1.6 oz (95.3 kg)     Height 08/31/19 0805 5\' 3"  (1.6 m)     Head Circumference --      Peak Flow --      Pain Score 08/31/19 0804 9     Pain Loc --      Pain Edu? --      Excl. in [place]? --      Constitutional: Alert and oriented. Well appearing and in no acute distress. Eyes: Conjunctivae are normal. PERRL. EOMI. Head: Atraumatic. ENT:      Ears:      Nose: No congestion/rhinnorhea.      Mouth/Throat: Mucous membranes are moist.  Neck: No stridor.   Cardiovascular: Normal rate, regular rhythm.  Good peripheral circulation.  Symmetric radial pulses bilaterally. Respiratory: Normal respiratory effort without tachypnea or retractions. Lungs CTAB. Good air entry to the bases with no decreased or absent breath sounds. Musculoskeletal: Full range of motion to all extremities. No gross deformities appreciated.  Limited range of motion of right shoulder due to pain.  Full range of motion of right hand.  Able to perform finger  opposition and thumbs up. Neurologic:  Normal speech and language. No gross focal neurologic deficits are appreciated.  Skin:  Skin is warm, dry and intact. No rash noted. Psychiatric: Mood and affect are normal. Speech and behavior are normal. Patient exhibits appropriate insight and judgement.   ____________________________________________   LABS (all labs ordered are listed, but only abnormal results are displayed)  Labs Reviewed - No data to display ____________________________________________  EKG   ____________________________________________  RADIOLOGY Robinette Haines, personally viewed and evaluated these images (plain radiographs) as part  of my medical decision making, as well as reviewing the written report by the radiologist.  DG Shoulder Right  Result Date: 08/31/2019 CLINICAL DATA:  Status post fall with right shoulder pain. EXAM: RIGHT SHOULDER - 2+ VIEW COMPARISON:  None. FINDINGS: Comminuted displaced fracture of the right proximal humeral shaft and neck is identified. The visualized lung field is normal. There is no dislocation. IMPRESSION: Comminuted displaced fracture of the right proximal humeral shaft and neck. There is no dislocation. Electronically Signed   By: Abelardo Diesel M.D.   On: 08/31/2019 09:05   CT Shoulder Right Wo Contrast  Result Date: 08/31/2019 CLINICAL DATA:  Fall last night. Right shoulder fracture. EXAM: CT OF THE UPPER RIGHT EXTREMITY WITHOUT CONTRAST TECHNIQUE: Multidetector CT imaging of the right shoulder was performed according to the standard protocol. COMPARISON:  Radiographs same date. FINDINGS: Bones/Joint/Cartilage There is a comminuted fracture of the surgical and anatomic necks of the right humerus. This fracture is impacted and demonstrates anteromedial displacement up to 2.0 cm. The fracture involves both tuberosities but not the articular surface of the humeral head. The humeral head remains located. There is no glenoid or other  scapular fracture. Moderate acromioclavicular degenerative changes are present. Small hemarthrosis. Ligaments Suboptimally assessed by CT. Muscles and Tendons No rotator cuff muscular atrophy or gross abnormality of the rotator cuff. Soft tissues Soft tissue swelling around the humeral fracture and superficial to the deltoid muscle. No focal fluid collection or foreign body. IMPRESSION: 1. Comminuted and impacted fracture of the surgical and anatomic necks of the right humerus as described. 2. No evidence of glenoid or other scapular fracture. 3. Small hemarthrosis. Electronically Signed   By: Richardean Sale M.D.   On: 08/31/2019 11:23    ____________________________________________    PROCEDURES  Procedure(s) performed:    Procedures    Medications  oxyCODONE-acetaminophen (PERCOCET/ROXICET) 5-325 MG per tablet 1 tablet (1 tablet Oral Given 08/31/19 0917)     ____________________________________________   INITIAL IMPRESSION / ASSESSMENT AND PLAN / ED COURSE  Pertinent labs & imaging results that were available during my care of the patient were reviewed by me and considered in my medical decision making (see chart for details).  Review of the [place] CSRS was performed in accordance of the [place] prior to dispensing any controlled drugs.   Patient presented to emergency department for evaluation of right shoulder pain after injury last night.  Vital signs and exam are reassuring.  X-ray consistent with comminuted humeral shaft and neck fracture.  Dr. Donivan Scull was consulted and recommends that patient be placed in a shoulder sling with swath following a CT of the shoulder and to follow-up with Dr. Marry Guan this week in clinic.  Social work was consulted for discuss in-home care for patient as she is recovering.  Social work came to see the patient with resources for her and provided her with a bedside commode.  Home health consult was placed to call the patient.  Patient will be staying with  her daughter this week so she has ample help this week.  Shoulder sling with swath was placed.  Patient will be discharged home with prescriptions for percocet. Patient is to follow up with orthopedics as directed.  Patient will call Dr. Marry Guan tomorrow for Brandy appointment for follow-up.  Patient is given ED precautions to return to the ED for any worsening or new symptoms.  Brandy George was evaluated in Emergency Department on 08/31/2019 for the symptoms described in the history of present illness.  She was evaluated in the context of the global COVID-19 pandemic, which necessitated consideration that the patient might be at risk for infection with the SARS-CoV-2 virus that causes COVID-19. Institutional protocols and algorithms that pertain to the evaluation of patients at risk for COVID-19 are in a state of rapid change based on information released by regulatory bodies including the CDC and federal and state organizations. These policies and algorithms were followed during the patient's care in the ED.   ____________________________________________  FINAL CLINICAL IMPRESSION(S) / ED DIAGNOSES  Final diagnoses:  Closed displaced fracture of surgical neck of right humerus, unspecified fracture morphology, initial encounter      NEW MEDICATIONS STARTED DURING THIS VISIT:  ED Discharge Orders         Ordered    oxyCODONE-acetaminophen (PERCOCET) 5-325 MG tablet  Every 6 hours PRN     08/31/19 1258              This chart was dictated using voice recognition software/Dragon. Despite best efforts to proofread, errors can occur which can change the meaning. Any change was purely unintentional.    Laban Emperor, PA-C 08/31/19 1446    Duffy Bruce, MD 09/02/19 1038

## 2019-08-31 NOTE — ED Triage Notes (Signed)
Fell last night, c/o right shoulder pain.  [address] [address] after getting her feet 'tangled' in a chair.

## 2021-10-24 ENCOUNTER — Other Ambulatory Visit: Payer: Self-pay | Admitting: Nephrology

## 2021-10-24 ENCOUNTER — Other Ambulatory Visit (HOSPITAL_COMMUNITY): Payer: Self-pay | Admitting: Nephrology

## 2021-10-24 DIAGNOSIS — N184 Chronic kidney disease, stage 4 (severe): Secondary | ICD-10-CM

## 2021-11-01 ENCOUNTER — Other Ambulatory Visit: Payer: Self-pay | Admitting: Nephrology

## 2021-11-01 DIAGNOSIS — E213 Hyperparathyroidism, unspecified: Secondary | ICD-10-CM

## 2021-11-03 ENCOUNTER — Ambulatory Visit
Admission: RE | Admit: 2021-11-03 | Discharge: 2021-11-03 | Disposition: A | Discharge status: Home / Self Care | Payer: Medicare Other | Source: Ambulatory Visit | Attending: Nephrology | Admitting: Nephrology

## 2021-11-03 ENCOUNTER — Ambulatory Visit: Payer: Medicare Other

## 2021-11-03 ENCOUNTER — Encounter
Admission: RE | Admit: 2021-11-03 | Discharge: 2021-11-03 | Disposition: A | Discharge status: Home / Self Care | Payer: Medicare Other | Source: Ambulatory Visit | Attending: Nephrology | Admitting: Nephrology

## 2021-11-03 ENCOUNTER — Other Ambulatory Visit: Payer: Self-pay

## 2021-11-03 DIAGNOSIS — E213 Hyperparathyroidism, unspecified: Secondary | ICD-10-CM | POA: Diagnosis present

## 2021-11-03 MED ORDER — TECHNETIUM TC 99M SESTAMIBI GENERIC - CARDIOLITE
25.0000 | Freq: Once | INTRAVENOUS | Status: AC
  Administered 2021-11-03: 25.82 via INTRAVENOUS

## 2022-04-13 ENCOUNTER — Ambulatory Visit: Payer: Medicare Other | Attending: Neurology | Admitting: Speech Pathology

## 2022-04-13 DIAGNOSIS — R41841 Cognitive communication deficit: Secondary | ICD-10-CM | POA: Insufficient documentation

## 2022-04-17 NOTE — Therapy (Signed)
OUTPATIENT SPEECH LANGUAGE PATHOLOGY EVALUATION   Patient Name: Brandy George MRN: [id number] DOB:1945/12/10, 76 y.o., female Today's Date: 04/17/2022  PCP: Belinda Fisher, MD REFERRING PROVIDER: Jennings Books, MD   End of Session - 04/17/22 0816     Visit Number 1    Number of Visits 1    SLP Start Time 25    SLP Stop Time  8416    SLP Time Calculation (min) 45 min    Activity Tolerance Patient tolerated treatment well             Past Medical History:  Diagnosis Date   Arthritis    Complication of anesthesia    BP DOWN WITH PREVIOUS ANESTHESIA   Depression    Diabetes mellitus without complication ([place])    GERD (gastroesophageal reflux disease)    History of hiatal hernia    Hyperlipidemia    Hypertension    Hypothyroidism    IBS (irritable bowel syndrome)    Thyroid disease    Past Surgical History:  Procedure Laterality Date   ABDOMINAL HYSTERECTOMY  1972   partial   APPENDECTOMY  06/28/13   CATARACT EXTRACTION W/PHACO Right 07/18/2017   Procedure: CATARACT EXTRACTION PHACO AND INTRAOCULAR LENS PLACEMENT ([place]);  Surgeon: Birder Robson, MD;  Location: [facility] ORS;  Service: Ophthalmology;  Laterality: Right;  [place] 00:33 AP% 20.7 CDE 6.99 Fluid pack lot # [id number]   CHOLECYSTECTOMY     COLONOSCOPY  2012   EYE SURGERY     JOINT REPLACEMENT Bilateral 2002,2006   JOINT REPLACEMENT Right 2011   hip   SPINE SURGERY     lower back   UPPER GI ENDOSCOPY  2012   Patient Active Problem List   Diagnosis Date Noted   Postoperative intra-abdominal abscess 07/18/2013   Diarrhea 07/15/2013    ONSET DATE: 03/20/2022   REFERRING DIAG: Mild Cognitive Impairment  THERAPY DIAG:  Cognitive communication deficit  Rationale for Evaluation and Treatment Rehabilitation  SUBJECTIVE:   SUBJECTIVE STATEMENT: Pt concerned with word finding difficulty Pt accompanied by: self  PAIN:  Are you having pain? No   FALLS: Has patient fallen in last 6 months?   No  LIVING ENVIRONMENT: Lives with: lives alone Lives in: House/apartment  PLOF:  Level of assistance: Independent with ADLs, Independent with IADLs Employment: Retired   PATIENT GOALS to prevent further cognitive decline  OBJECTIVE:   COGNITION: Overall cognitive status: Within functional limits for tasks assessed  COGNITIVE COMMUNICATION Auditory comprehension: WFL Verbal expression: WFL Functional communication: WFL  ORAL MOTOR EXAMINATION Facial : WFL Lingual: WFL Velum: WFL Mandible: WFL Cough: WFL Voice: WFL   STANDARDIZED ASSESSMENTS: The "[place] Mental Status" (SLUMS) Examination was administered. Pt scored **/30, raising concern for the presence of a neurocognitive disorder. Further testing would be beneficial, as deficits of attention, memory, oriantion, problem solving, and executive functions identified today may negatively impact pt safety with independent living.   SLUMS Examination Orientation  3/3  Numeric Problem Solving  1/3  Memory  5/5  Attention 2/2  Thought Organization 3/3  Clock Drawing 2/4  Visuospatial Skills               2/2  Short Story Recall  8/8  Total  26/30     Scoring  High School Education  Less than High School Education   Normal  27-30 25-30  Mild Neurocognitive Disorder 21-26 20-24  Dementia  1-20 1-19      PATIENT REPORTED OUTCOME MEASURES (PROM): Communication Effectiveness  Survey: within normal limits    PATIENT EDUCATION: Education details: results of SLUMS, functional cognitive communication abilities Person educated: Patient Education method: Explanation and Verbal cues Education comprehension: verbalized understanding and returned demonstration     GOALS: N/A Evaluation only  ASSESSMENT:  CLINICAL IMPRESSION: Patient is a 76 y.o. female who presents with concerns for memory loss. Pt describes it as having a word on the "tip of my tongue but I can't say it." She further describes that  she uses alternate words (synonyms), attributes to describe etc. While this is a change for pt, she is already utilizing compensatory strategies. Education provided on appropriate functional ability on SLUMS and possible age related cognitive changes as well as ways to decrease environmental distractions. Pt also states that she is effectively using sticky notes, grocery lists and calendar to aid in recall of information. At this time, skilled ST intervention is not indicated.   Julien Berryman B. Rutherford Nail, M.S., CCC-SLP, Mining engineer Certified Brain Injury [place]  [place] Office [phone] Ascom [phone] Fax [phone]

## 2022-04-19 ENCOUNTER — Encounter: Payer: Medicare Other | Admitting: Speech Pathology

## 2022-04-21 ENCOUNTER — Encounter: Payer: PRIVATE HEALTH INSURANCE | Admitting: Speech Pathology

## 2022-04-28 ENCOUNTER — Encounter: Payer: PRIVATE HEALTH INSURANCE | Admitting: Speech Pathology

## 2022-05-03 ENCOUNTER — Encounter: Payer: PRIVATE HEALTH INSURANCE | Admitting: Speech Pathology

## 2022-05-05 ENCOUNTER — Encounter: Payer: PRIVATE HEALTH INSURANCE | Admitting: Speech Pathology

## 2022-05-10 ENCOUNTER — Encounter: Payer: PRIVATE HEALTH INSURANCE | Admitting: Speech Pathology

## 2022-05-12 ENCOUNTER — Encounter: Payer: PRIVATE HEALTH INSURANCE | Admitting: Speech Pathology

## 2022-05-15 ENCOUNTER — Encounter: Payer: PRIVATE HEALTH INSURANCE | Admitting: Speech Pathology

## 2022-05-17 ENCOUNTER — Encounter: Payer: PRIVATE HEALTH INSURANCE | Admitting: Speech Pathology

## 2022-05-22 ENCOUNTER — Encounter: Payer: PRIVATE HEALTH INSURANCE | Admitting: Speech Pathology

## 2022-05-24 ENCOUNTER — Encounter: Payer: PRIVATE HEALTH INSURANCE | Admitting: Speech Pathology

## 2022-05-29 ENCOUNTER — Encounter: Payer: PRIVATE HEALTH INSURANCE | Admitting: Speech Pathology

## 2022-05-31 ENCOUNTER — Encounter: Payer: PRIVATE HEALTH INSURANCE | Admitting: Speech Pathology

## 2022-06-05 ENCOUNTER — Encounter: Payer: PRIVATE HEALTH INSURANCE | Admitting: Speech Pathology

## 2022-06-07 ENCOUNTER — Encounter: Payer: PRIVATE HEALTH INSURANCE | Admitting: Speech Pathology

## 2022-06-12 ENCOUNTER — Encounter: Payer: PRIVATE HEALTH INSURANCE | Admitting: Speech Pathology

## 2022-06-14 ENCOUNTER — Encounter: Payer: PRIVATE HEALTH INSURANCE | Admitting: Speech Pathology

## 2022-06-19 ENCOUNTER — Encounter: Payer: PRIVATE HEALTH INSURANCE | Admitting: Speech Pathology

## 2022-06-21 ENCOUNTER — Encounter: Payer: PRIVATE HEALTH INSURANCE | Admitting: Speech Pathology

## 2022-06-26 ENCOUNTER — Encounter: Payer: PRIVATE HEALTH INSURANCE | Admitting: Speech Pathology

## 2022-06-28 ENCOUNTER — Encounter: Payer: PRIVATE HEALTH INSURANCE | Admitting: Speech Pathology

## 2022-07-03 ENCOUNTER — Encounter: Payer: PRIVATE HEALTH INSURANCE | Admitting: Speech Pathology

## 2022-07-05 ENCOUNTER — Encounter: Payer: PRIVATE HEALTH INSURANCE | Admitting: Speech Pathology

## 2022-07-10 ENCOUNTER — Encounter: Payer: PRIVATE HEALTH INSURANCE | Admitting: Speech Pathology

## 2022-07-12 ENCOUNTER — Encounter: Payer: PRIVATE HEALTH INSURANCE | Admitting: Speech Pathology

## 2022-07-17 ENCOUNTER — Encounter: Payer: PRIVATE HEALTH INSURANCE | Admitting: Speech Pathology

## 2022-07-19 ENCOUNTER — Encounter: Payer: PRIVATE HEALTH INSURANCE | Admitting: Speech Pathology

## 2022-07-24 ENCOUNTER — Encounter: Payer: PRIVATE HEALTH INSURANCE | Admitting: Speech Pathology

## 2022-07-26 ENCOUNTER — Encounter: Payer: PRIVATE HEALTH INSURANCE | Admitting: Speech Pathology

## 2022-07-31 ENCOUNTER — Encounter: Payer: PRIVATE HEALTH INSURANCE | Admitting: Speech Pathology

## 2022-08-02 ENCOUNTER — Encounter: Payer: PRIVATE HEALTH INSURANCE | Admitting: Speech Pathology

## 2022-08-07 ENCOUNTER — Encounter: Payer: PRIVATE HEALTH INSURANCE | Admitting: Speech Pathology

## 2022-08-09 ENCOUNTER — Encounter: Payer: PRIVATE HEALTH INSURANCE | Admitting: Speech Pathology

## 2022-08-14 ENCOUNTER — Encounter: Payer: PRIVATE HEALTH INSURANCE | Admitting: Speech Pathology

## 2022-08-16 ENCOUNTER — Encounter: Payer: PRIVATE HEALTH INSURANCE | Admitting: Speech Pathology

## 2022-08-21 ENCOUNTER — Encounter: Payer: PRIVATE HEALTH INSURANCE | Admitting: Speech Pathology

## 2022-08-23 ENCOUNTER — Encounter: Payer: PRIVATE HEALTH INSURANCE | Admitting: Speech Pathology

## 2022-08-30 ENCOUNTER — Encounter: Payer: PRIVATE HEALTH INSURANCE | Admitting: Speech Pathology

## 2023-09-14 IMAGING — CT NM PARATHYROID W/ SPECT / CT
1 series · 12 of 14 positions shown, 15 images · non-contrast
Comparison: 11/03/2021

CLINICAL DATA: Hyperparathyroidism, hypercalcemia

EXAM:
NM PARATHYROID SCINTIGRAPHY AND SPECT IMAGING
TECHNIQUE: Following intravenous administration of radiopharmaceutical, early
and 2-hour delayed planar images were obtained in the anterior
projection. Delayed triplanar SPECT images were also obtained at 2
hours.
RADIOPHARMACEUTICALS:  25.82 mCi Ac-TTm Sestamibi IV

[Series 3: 3d parathroid 1.25 b31s · axial · 0.98mm/px · z∈[+1421,+1738]mm · 12 of 470 slices shown, 15 images]
[im 37/470  soft-tissue]
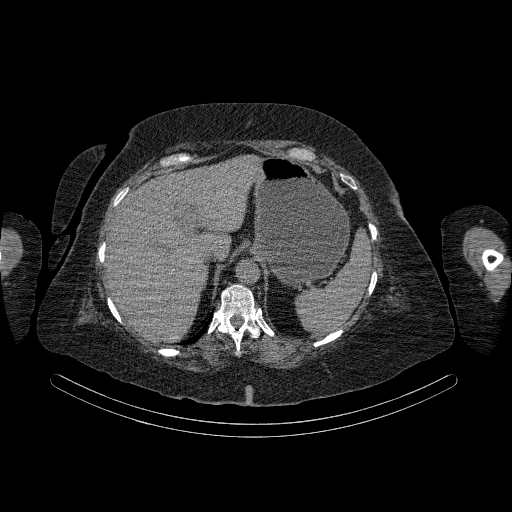
[im 37/470  bone]
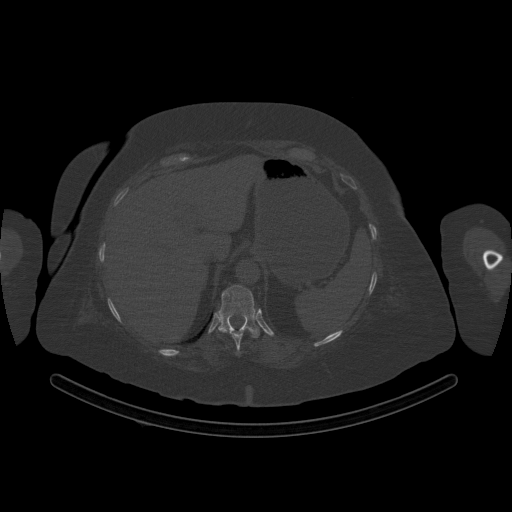
[im 73/470  bone]
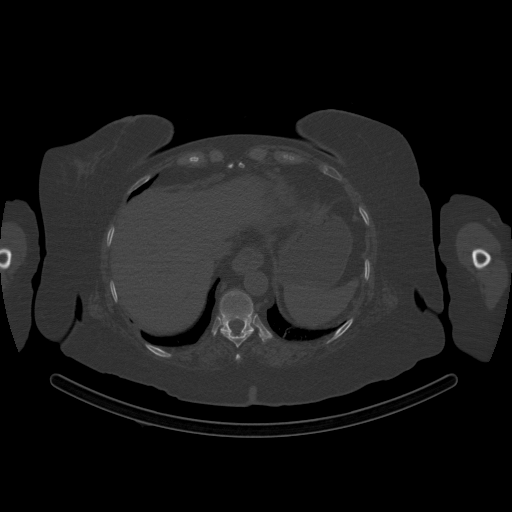
[im 109/470  bone]
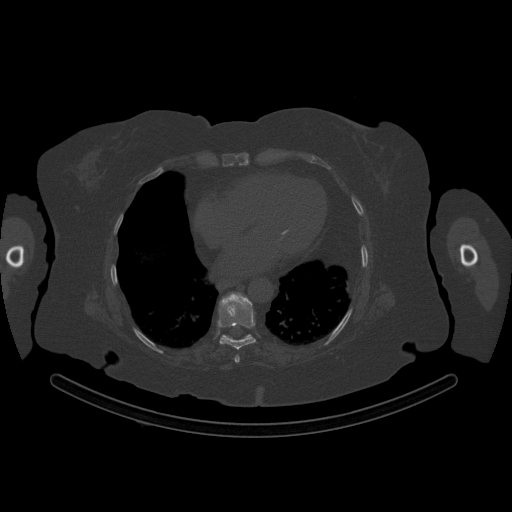
[im 145/470  bone]
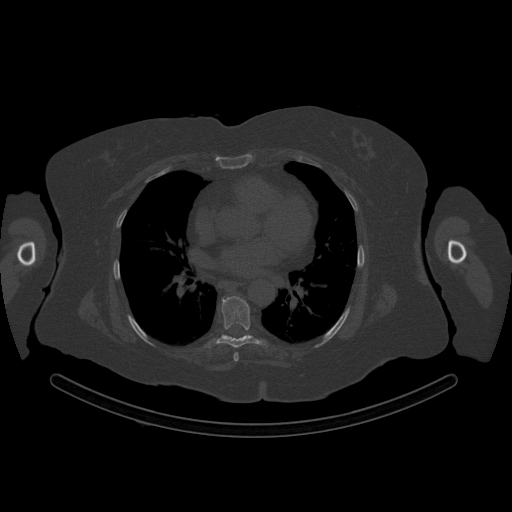
[im 181/470  soft-tissue]
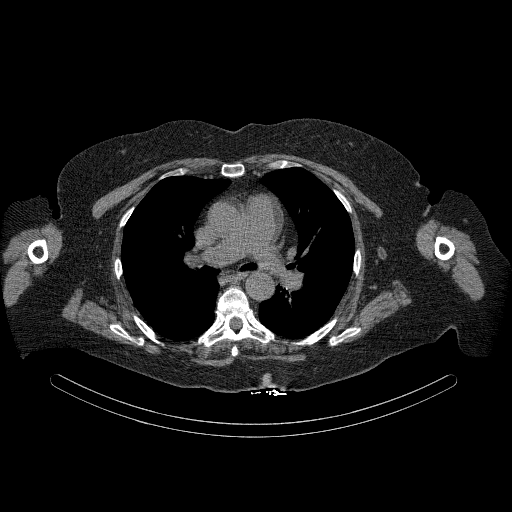
[im 181/470  bone]
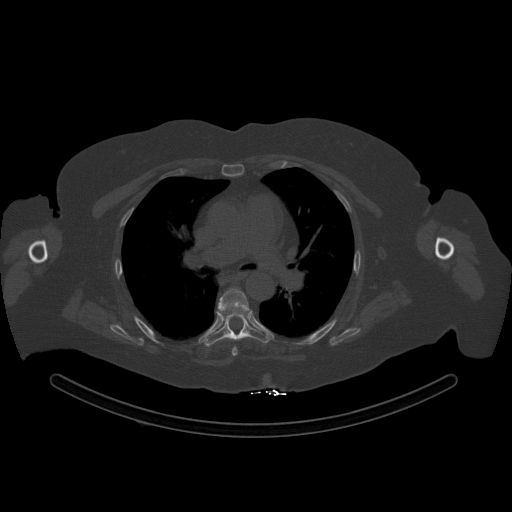
[im 217/470  bone]
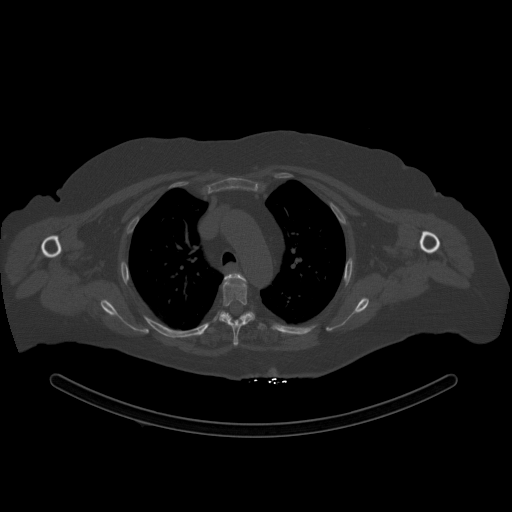
[im 253/470  bone]
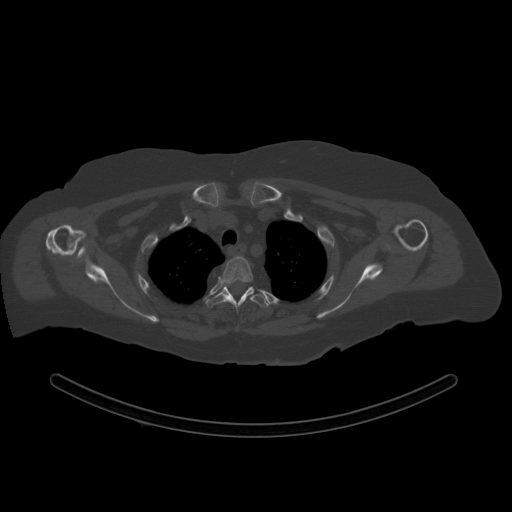
[im 289/470  bone]
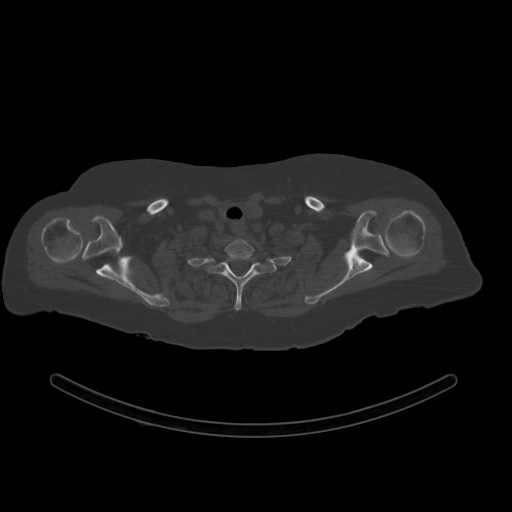
[im 325/470  soft-tissue]
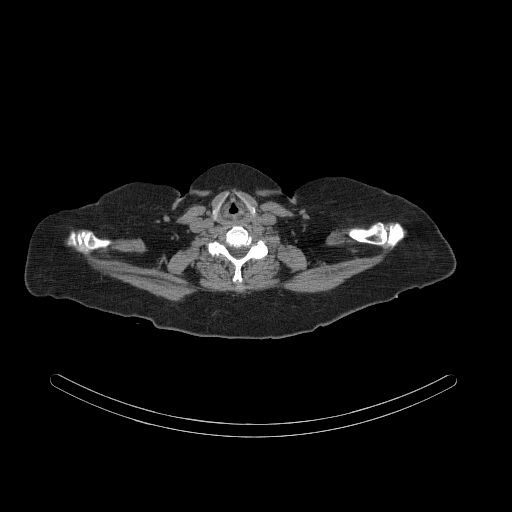
[im 325/470  bone]
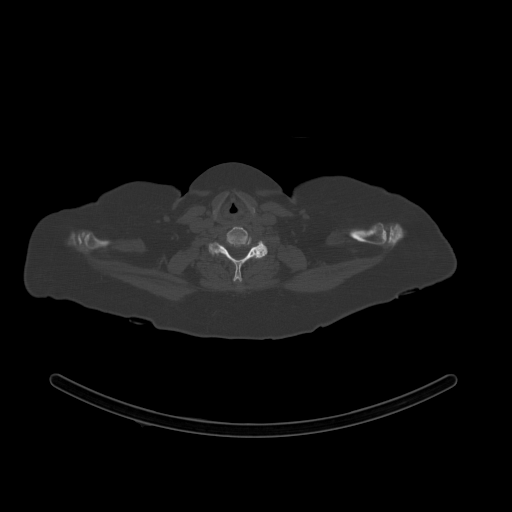
[im 361/470  bone]
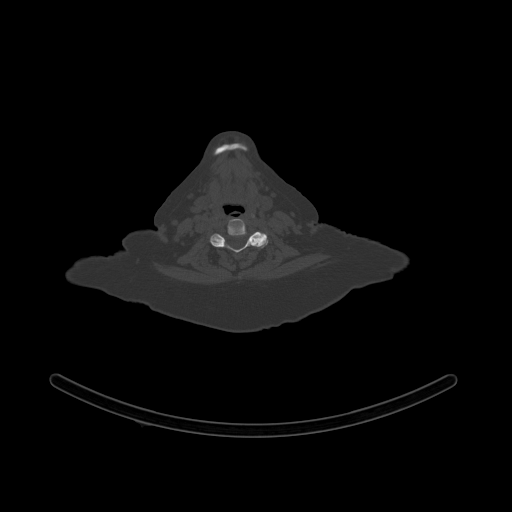
[im 397/470  bone]
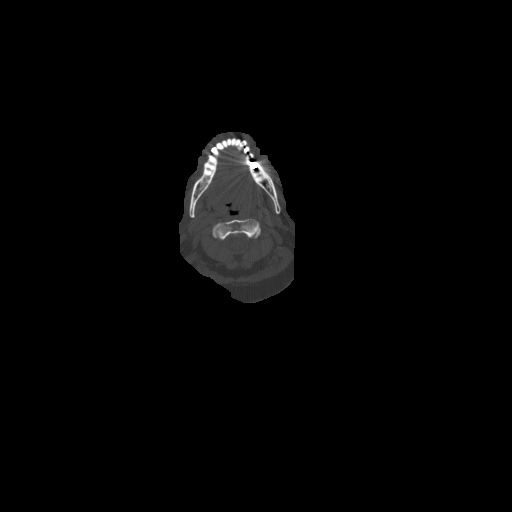
[im 433/470  bone]
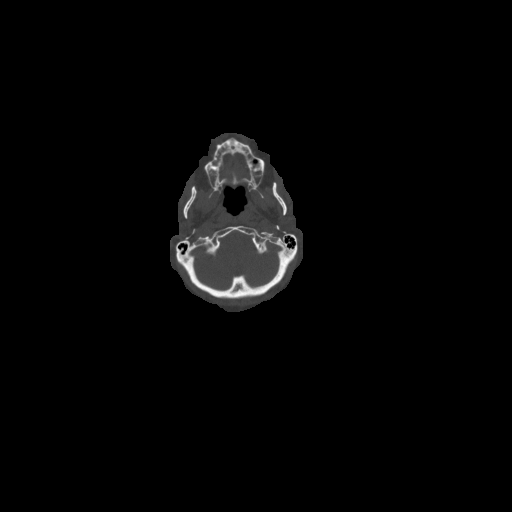

[12 of 14 positions shown; findings below may reference images not displayed]

FINDINGS: Anterior planar [HOSPITAL] 15 minutes demonstrates normal
distribution of radiotracer within the salivary glands. There is an
oval shaped area of increased radiotracer uptake within the left
side neck, corresponding to the solid lesion seen on recent
ultrasound.

On delayed 2 hour imaging, planar and SPECT images demonstrate
persistent radiotracer activity localizing to the solid left
paratracheal nodule seen on prior ultrasound, reference image 136 on
the axial fused data set. No other persistent abnormal radiotracer
activity.
IMPRESSION: 1. Persistent increased radiotracer uptake within a rounded soft
tissue nodule in the left paratracheal region, consistent with
parathyroid adenoma. This corresponds to the ultrasound finding.

## 2023-09-18 DIAGNOSIS — M48062 Spinal stenosis, lumbar region with neurogenic claudication: Secondary | ICD-10-CM | POA: Diagnosis not present

## 2023-09-18 DIAGNOSIS — Z23 Encounter for immunization: Secondary | ICD-10-CM | POA: Diagnosis not present

## 2023-09-18 DIAGNOSIS — R413 Other amnesia: Secondary | ICD-10-CM | POA: Diagnosis not present

## 2023-09-18 DIAGNOSIS — E890 Postprocedural hypothyroidism: Secondary | ICD-10-CM | POA: Diagnosis not present

## 2023-09-18 DIAGNOSIS — F339 Major depressive disorder, recurrent, unspecified: Secondary | ICD-10-CM | POA: Diagnosis not present

## 2023-09-18 DIAGNOSIS — F411 Generalized anxiety disorder: Secondary | ICD-10-CM | POA: Diagnosis not present

## 2023-09-18 DIAGNOSIS — M545 Low back pain, unspecified: Secondary | ICD-10-CM | POA: Diagnosis not present

## 2023-09-18 DIAGNOSIS — M818 Other osteoporosis without current pathological fracture: Secondary | ICD-10-CM | POA: Diagnosis not present

## 2023-09-18 DIAGNOSIS — N1832 Chronic kidney disease, stage 3b: Secondary | ICD-10-CM | POA: Diagnosis not present

## 2023-09-18 DIAGNOSIS — H9192 Unspecified hearing loss, left ear: Secondary | ICD-10-CM | POA: Diagnosis not present

## 2023-09-18 DIAGNOSIS — Z6837 Body mass index (BMI) 37.0-37.9, adult: Secondary | ICD-10-CM | POA: Diagnosis not present

## 2023-09-18 DIAGNOSIS — Z0000 Encounter for general adult medical examination without abnormal findings: Secondary | ICD-10-CM | POA: Diagnosis not present

## 2023-09-18 DIAGNOSIS — E782 Mixed hyperlipidemia: Secondary | ICD-10-CM | POA: Diagnosis not present

## 2023-10-31 DIAGNOSIS — I129 Hypertensive chronic kidney disease with stage 1 through stage 4 chronic kidney disease, or unspecified chronic kidney disease: Secondary | ICD-10-CM | POA: Diagnosis not present

## 2023-10-31 DIAGNOSIS — N3001 Acute cystitis with hematuria: Secondary | ICD-10-CM | POA: Diagnosis not present

## 2023-10-31 DIAGNOSIS — N184 Chronic kidney disease, stage 4 (severe): Secondary | ICD-10-CM | POA: Diagnosis not present

## 2023-10-31 DIAGNOSIS — N1832 Chronic kidney disease, stage 3b: Secondary | ICD-10-CM | POA: Diagnosis not present

## 2023-10-31 DIAGNOSIS — I10 Essential (primary) hypertension: Secondary | ICD-10-CM | POA: Diagnosis not present

## 2023-11-07 DIAGNOSIS — I129 Hypertensive chronic kidney disease with stage 1 through stage 4 chronic kidney disease, or unspecified chronic kidney disease: Secondary | ICD-10-CM | POA: Diagnosis not present

## 2023-11-07 DIAGNOSIS — N1832 Chronic kidney disease, stage 3b: Secondary | ICD-10-CM | POA: Diagnosis not present

## 2023-11-27 DIAGNOSIS — G3184 Mild cognitive impairment, so stated: Secondary | ICD-10-CM | POA: Diagnosis not present

## 2023-11-27 DIAGNOSIS — G8929 Other chronic pain: Secondary | ICD-10-CM | POA: Diagnosis not present

## 2023-11-27 DIAGNOSIS — M5441 Lumbago with sciatica, right side: Secondary | ICD-10-CM | POA: Diagnosis not present

## 2023-11-27 DIAGNOSIS — R42 Dizziness and giddiness: Secondary | ICD-10-CM | POA: Diagnosis not present

## 2023-11-27 DIAGNOSIS — M5442 Lumbago with sciatica, left side: Secondary | ICD-10-CM | POA: Diagnosis not present

## 2024-01-31 DIAGNOSIS — R8290 Unspecified abnormal findings in urine: Secondary | ICD-10-CM | POA: Diagnosis not present

## 2024-01-31 DIAGNOSIS — E890 Postprocedural hypothyroidism: Secondary | ICD-10-CM | POA: Diagnosis not present

## 2024-01-31 DIAGNOSIS — N1832 Chronic kidney disease, stage 3b: Secondary | ICD-10-CM | POA: Diagnosis not present

## 2024-01-31 DIAGNOSIS — Z6837 Body mass index (BMI) 37.0-37.9, adult: Secondary | ICD-10-CM | POA: Diagnosis not present

## 2024-01-31 DIAGNOSIS — F411 Generalized anxiety disorder: Secondary | ICD-10-CM | POA: Diagnosis not present

## 2024-01-31 DIAGNOSIS — E782 Mixed hyperlipidemia: Secondary | ICD-10-CM | POA: Diagnosis not present

## 2024-01-31 DIAGNOSIS — M818 Other osteoporosis without current pathological fracture: Secondary | ICD-10-CM | POA: Diagnosis not present

## 2024-01-31 DIAGNOSIS — H9192 Unspecified hearing loss, left ear: Secondary | ICD-10-CM | POA: Diagnosis not present

## 2024-01-31 DIAGNOSIS — R413 Other amnesia: Secondary | ICD-10-CM | POA: Diagnosis not present

## 2024-01-31 DIAGNOSIS — R7309 Other abnormal glucose: Secondary | ICD-10-CM | POA: Diagnosis not present

## 2024-02-07 DIAGNOSIS — M48062 Spinal stenosis, lumbar region with neurogenic claudication: Secondary | ICD-10-CM | POA: Diagnosis not present

## 2024-02-07 DIAGNOSIS — Z1331 Encounter for screening for depression: Secondary | ICD-10-CM | POA: Diagnosis not present

## 2024-02-07 DIAGNOSIS — F330 Major depressive disorder, recurrent, mild: Secondary | ICD-10-CM | POA: Diagnosis not present

## 2024-02-07 DIAGNOSIS — E7800 Pure hypercholesterolemia, unspecified: Secondary | ICD-10-CM | POA: Diagnosis not present

## 2024-02-07 DIAGNOSIS — R413 Other amnesia: Secondary | ICD-10-CM | POA: Diagnosis not present

## 2024-02-07 DIAGNOSIS — E669 Obesity, unspecified: Secondary | ICD-10-CM | POA: Diagnosis not present

## 2024-02-07 DIAGNOSIS — I10 Essential (primary) hypertension: Secondary | ICD-10-CM | POA: Diagnosis not present

## 2024-02-07 DIAGNOSIS — K219 Gastro-esophageal reflux disease without esophagitis: Secondary | ICD-10-CM | POA: Diagnosis not present

## 2024-02-07 DIAGNOSIS — Z780 Asymptomatic menopausal state: Secondary | ICD-10-CM | POA: Diagnosis not present

## 2024-02-07 DIAGNOSIS — Z6834 Body mass index (BMI) 34.0-34.9, adult: Secondary | ICD-10-CM | POA: Diagnosis not present

## 2024-02-07 DIAGNOSIS — E890 Postprocedural hypothyroidism: Secondary | ICD-10-CM | POA: Diagnosis not present

## 2024-02-07 DIAGNOSIS — Z0000 Encounter for general adult medical examination without abnormal findings: Secondary | ICD-10-CM | POA: Diagnosis not present

## 2024-02-07 DIAGNOSIS — F411 Generalized anxiety disorder: Secondary | ICD-10-CM | POA: Diagnosis not present

## 2024-02-11 DIAGNOSIS — Z1231 Encounter for screening mammogram for malignant neoplasm of breast: Secondary | ICD-10-CM | POA: Diagnosis not present

## 2024-02-28 DIAGNOSIS — M810 Age-related osteoporosis without current pathological fracture: Secondary | ICD-10-CM | POA: Diagnosis not present

## 2024-05-07 DIAGNOSIS — I129 Hypertensive chronic kidney disease with stage 1 through stage 4 chronic kidney disease, or unspecified chronic kidney disease: Secondary | ICD-10-CM | POA: Diagnosis not present

## 2024-05-07 DIAGNOSIS — N1832 Chronic kidney disease, stage 3b: Secondary | ICD-10-CM | POA: Diagnosis not present

## 2024-05-12 DIAGNOSIS — G3184 Mild cognitive impairment, so stated: Secondary | ICD-10-CM | POA: Diagnosis not present

## 2024-05-12 DIAGNOSIS — R42 Dizziness and giddiness: Secondary | ICD-10-CM | POA: Diagnosis not present

## 2024-05-12 DIAGNOSIS — G8929 Other chronic pain: Secondary | ICD-10-CM | POA: Diagnosis not present

## 2024-05-12 DIAGNOSIS — M5441 Lumbago with sciatica, right side: Secondary | ICD-10-CM | POA: Diagnosis not present

## 2024-05-12 DIAGNOSIS — M5442 Lumbago with sciatica, left side: Secondary | ICD-10-CM | POA: Diagnosis not present

## 2024-05-14 DIAGNOSIS — I129 Hypertensive chronic kidney disease with stage 1 through stage 4 chronic kidney disease, or unspecified chronic kidney disease: Secondary | ICD-10-CM | POA: Diagnosis not present

## 2024-05-14 DIAGNOSIS — N1832 Chronic kidney disease, stage 3b: Secondary | ICD-10-CM | POA: Diagnosis not present

## 2024-05-14 DIAGNOSIS — R801 Persistent proteinuria, unspecified: Secondary | ICD-10-CM | POA: Diagnosis not present

## 2024-06-02 DIAGNOSIS — Z6834 Body mass index (BMI) 34.0-34.9, adult: Secondary | ICD-10-CM | POA: Diagnosis not present

## 2024-06-02 DIAGNOSIS — R413 Other amnesia: Secondary | ICD-10-CM | POA: Diagnosis not present

## 2024-06-02 DIAGNOSIS — K219 Gastro-esophageal reflux disease without esophagitis: Secondary | ICD-10-CM | POA: Diagnosis not present

## 2024-06-02 DIAGNOSIS — Z780 Asymptomatic menopausal state: Secondary | ICD-10-CM | POA: Diagnosis not present

## 2024-06-02 DIAGNOSIS — E890 Postprocedural hypothyroidism: Secondary | ICD-10-CM | POA: Diagnosis not present

## 2024-06-02 DIAGNOSIS — Z0000 Encounter for general adult medical examination without abnormal findings: Secondary | ICD-10-CM | POA: Diagnosis not present

## 2024-06-02 DIAGNOSIS — M48062 Spinal stenosis, lumbar region with neurogenic claudication: Secondary | ICD-10-CM | POA: Diagnosis not present

## 2024-06-02 DIAGNOSIS — R8290 Unspecified abnormal findings in urine: Secondary | ICD-10-CM | POA: Diagnosis not present

## 2024-06-02 DIAGNOSIS — I10 Essential (primary) hypertension: Secondary | ICD-10-CM | POA: Diagnosis not present

## 2024-06-02 DIAGNOSIS — R7309 Other abnormal glucose: Secondary | ICD-10-CM | POA: Diagnosis not present

## 2024-06-06 DIAGNOSIS — Z961 Presence of intraocular lens: Secondary | ICD-10-CM | POA: Diagnosis not present

## 2024-06-06 DIAGNOSIS — E119 Type 2 diabetes mellitus without complications: Secondary | ICD-10-CM | POA: Diagnosis not present

## 2024-06-06 DIAGNOSIS — H43813 Vitreous degeneration, bilateral: Secondary | ICD-10-CM | POA: Diagnosis not present

## 2024-06-09 DIAGNOSIS — F3340 Major depressive disorder, recurrent, in remission, unspecified: Secondary | ICD-10-CM | POA: Diagnosis not present

## 2024-06-09 DIAGNOSIS — M48062 Spinal stenosis, lumbar region with neurogenic claudication: Secondary | ICD-10-CM | POA: Diagnosis not present

## 2024-06-09 DIAGNOSIS — F411 Generalized anxiety disorder: Secondary | ICD-10-CM | POA: Diagnosis not present

## 2024-06-09 DIAGNOSIS — M818 Other osteoporosis without current pathological fracture: Secondary | ICD-10-CM | POA: Diagnosis not present

## 2024-06-09 DIAGNOSIS — I10 Essential (primary) hypertension: Secondary | ICD-10-CM | POA: Diagnosis not present

## 2024-06-09 DIAGNOSIS — Z6834 Body mass index (BMI) 34.0-34.9, adult: Secondary | ICD-10-CM | POA: Diagnosis not present

## 2024-06-09 DIAGNOSIS — R413 Other amnesia: Secondary | ICD-10-CM | POA: Diagnosis not present

## 2024-06-19 DIAGNOSIS — N2581 Secondary hyperparathyroidism of renal origin: Secondary | ICD-10-CM | POA: Diagnosis not present

## 2024-06-19 DIAGNOSIS — Z87891 Personal history of nicotine dependence: Secondary | ICD-10-CM | POA: Diagnosis not present

## 2024-06-19 DIAGNOSIS — M810 Age-related osteoporosis without current pathological fracture: Secondary | ICD-10-CM | POA: Diagnosis not present

## 2024-06-19 DIAGNOSIS — E890 Postprocedural hypothyroidism: Secondary | ICD-10-CM | POA: Diagnosis not present

## 2024-06-19 DIAGNOSIS — Z9189 Other specified personal risk factors, not elsewhere classified: Secondary | ICD-10-CM | POA: Diagnosis not present

## 2024-06-19 DIAGNOSIS — N1832 Chronic kidney disease, stage 3b: Secondary | ICD-10-CM | POA: Diagnosis not present

## 2024-07-18 DIAGNOSIS — M47816 Spondylosis without myelopathy or radiculopathy, lumbar region: Secondary | ICD-10-CM | POA: Diagnosis not present

## 2024-07-18 DIAGNOSIS — M549 Dorsalgia, unspecified: Secondary | ICD-10-CM | POA: Diagnosis not present

## 2024-07-24 NOTE — Progress Notes (Signed)
 Brandy George                                          MRN: [id number]   07/24/2024   The [facility] Quality Team Specialist reviewed this patient medical record for the purposes of chart review for care gap closure. The following were reviewed: chart review for care gap closure-controlling blood pressure.    [facility] Quality Team

## 2024-09-17 ENCOUNTER — Other Ambulatory Visit: Payer: Self-pay | Admitting: Family Medicine

## 2024-09-17 DIAGNOSIS — M5416 Radiculopathy, lumbar region: Secondary | ICD-10-CM

## 2024-09-20 ENCOUNTER — Ambulatory Visit
Admission: RE | Admit: 2024-09-20 | Discharge: 2024-09-20 | Disposition: A | Source: Ambulatory Visit | Attending: Family Medicine | Admitting: Family Medicine

## 2024-09-20 DIAGNOSIS — M5416 Radiculopathy, lumbar region: Secondary | ICD-10-CM
